# Patient Record
Sex: Female | Born: 1935 | Race: White | Hispanic: No | Marital: Single | Smoking: Never smoker
Health system: Southern US, Community
[De-identification: ages and names within clinical notes are randomized; demographics above are authoritative.]

## PROBLEM LIST (undated history)

## (undated) DIAGNOSIS — E039 Hypothyroidism, unspecified: Secondary | ICD-10-CM

## (undated) DIAGNOSIS — I639 Cerebral infarction, unspecified: Secondary | ICD-10-CM

## (undated) DIAGNOSIS — I10 Essential (primary) hypertension: Secondary | ICD-10-CM

## (undated) HISTORY — DX: Essential (primary) hypertension: I10

## (undated) HISTORY — PX: HYSTERECTOMY ABDOMINAL WITH SALPINGO-OOPHORECTOMY: SHX6792

---

## 2015-06-02 DEATH — deceased

## 2017-06-20 DIAGNOSIS — M1712 Unilateral primary osteoarthritis, left knee: Secondary | ICD-10-CM | POA: Insufficient documentation

## 2017-06-30 DIAGNOSIS — I10 Essential (primary) hypertension: Secondary | ICD-10-CM | POA: Insufficient documentation

## 2018-01-15 DIAGNOSIS — E039 Hypothyroidism, unspecified: Secondary | ICD-10-CM | POA: Insufficient documentation

## 2020-01-27 ENCOUNTER — Encounter: Payer: Self-pay | Admitting: Nurse Practitioner

## 2020-01-27 ENCOUNTER — Ambulatory Visit (INDEPENDENT_AMBULATORY_CARE_PROVIDER_SITE_OTHER): Payer: Medicare Other | Admitting: Nurse Practitioner

## 2020-01-27 ENCOUNTER — Other Ambulatory Visit: Payer: Self-pay

## 2020-01-27 VITALS — BP 148/68 | HR 65 | Temp 97.2°F | Ht 60.0 in | Wt 145.2 lb

## 2020-01-27 DIAGNOSIS — I10 Essential (primary) hypertension: Secondary | ICD-10-CM | POA: Diagnosis not present

## 2020-01-27 DIAGNOSIS — E039 Hypothyroidism, unspecified: Secondary | ICD-10-CM | POA: Diagnosis not present

## 2020-01-27 DIAGNOSIS — H9190 Unspecified hearing loss, unspecified ear: Secondary | ICD-10-CM | POA: Insufficient documentation

## 2020-01-27 DIAGNOSIS — N3000 Acute cystitis without hematuria: Secondary | ICD-10-CM

## 2020-01-27 DIAGNOSIS — I700 Atherosclerosis of aorta: Secondary | ICD-10-CM

## 2020-01-27 LAB — BASIC METABOLIC PANEL
BUN: 20 mg/dL (ref 6–23)
CO2: 32 mEq/L (ref 19–32)
Calcium: 9.8 mg/dL (ref 8.4–10.5)
Chloride: 102 mEq/L (ref 96–112)
Creatinine, Ser: 1.15 mg/dL (ref 0.40–1.20)
GFR: 44.01 mL/min — ABNORMAL LOW (ref >60.00)
Glucose, Bld: 126 mg/dL — ABNORMAL HIGH (ref 70–99)
Potassium: 4.6 mEq/L (ref 3.5–5.1)
Sodium: 137 mEq/L (ref 135–145)

## 2020-01-27 LAB — TSH: TSH: 10.02 u[IU]/mL — ABNORMAL HIGH (ref 0.35–4.50)

## 2020-01-27 LAB — T4, FREE: Free T4: 1.13 ng/dL (ref 0.60–1.60)

## 2020-01-27 NOTE — Progress Notes (Signed)
Subjective:  Patient ID: Shannon Morales, female    DOB: 12-06-1924  Age: 86 y.o. MRN: [id number]  CC: Establish Care (New patient, hospital follow up, seen for UTI no concerns. )  HPI Accompanied by daughter. She lives alone, but er daughter checks on her daily. She is independent with ADLs, but dependents on her daughter for transportation. UTI and GI symptoms and dizziness has resolved with use of oral abx and oral hydration.  Essential hypertension Stable BP with coreg, amlodipine and diovan/HCTZ Reviewed OV notes from previous pcp with [facility]: 04/2019 151/77 and 07/2019 198/78 BP Readings from Last 3 Encounters:  01/27/20 (!) 148/68   Maintain current dose. Check BMP today  Aortic atherosclerosis ([place]) Noted on CT ABD/pelvis completed during recent ED visit 01/22/2020. Last lipid panel done by previous pcp with [facility] 2017: TC 213, Trig 112, LDL 127. She is not interested in use of statin at this time No tobacco use No known hx of CAD/MI/CVD/PAD/DM  Hypothyroidism Elevated TSH in past. Repeat TSH and T4 today. Maintain current dose of levothyroxine while waiting for results  Reviewed past Medical, Social and Family history today.  Outpatient Medications Prior to Visit  Medication Sig Dispense Refill  . carvedilol (COREG) 12.5 MG tablet Take 12.5 mg by mouth daily.    . cephALEXin (KEFLEX) 500 MG capsule Take 500 mg by mouth 4 (four) times daily.    Marland Kitchen amLODipine (NORVASC) 5 MG tablet Take 5 mg by mouth daily.    Marland Kitchen levothyroxine (LEVOXYL) 75 MCG tablet Take 75 mcg by mouth in the morning.    . potassium chloride (KLOR-CON) 10 MEQ tablet Take 10 mEq by mouth in the morning.    . valsartan-hydrochlorothiazide (DIOVAN-HCT) 320-25 MG tablet Take 1 tablet by mouth daily.     No facility-administered medications prior to visit.    ROS See HPI  Objective:  BP (!) 148/68   Pulse 65   Temp (!) 97.2 F (36.2 C) (Tympanic)   Ht 5' (1.524 m)   Wt 145 lb 3.2 oz (65.9 kg)   SpO2  95%   BMI 28.36 kg/m   Physical Exam Vitals reviewed.  Cardiovascular:     Rate and Rhythm: Normal rate and regular rhythm.     Pulses: Normal pulses.     Heart sounds: Normal heart sounds.  Pulmonary:     Effort: Pulmonary effort is normal.     Breath sounds: Normal breath sounds.  Abdominal:     General: Bowel sounds are normal. There is no distension.     Palpations: Abdomen is soft.     Tenderness: There is no abdominal tenderness.  Musculoskeletal:     Right lower leg: No edema.     Left lower leg: No edema.  Skin:    General: Skin is warm and dry.  Neurological:     Mental Status: She is alert and oriented to person, place, and time.  Psychiatric:        Mood and Affect: Mood normal.        Behavior: Behavior normal.        Thought Content: Thought content normal.    Assessment & Plan:  This visit occurred during the SARS-CoV-2 public health emergency.  Safety protocols were in place, including screening questions prior to the visit, additional usage of staff PPE, and extensive cleaning of exam room while observing appropriate contact time as indicated for disinfecting solutions.   Shanyce was seen today for establish care.  Diagnoses and all  orders for this visit:  Essential hypertension -     Basic metabolic panel -     amLODipine (NORVASC) 5 MG tablet; Take 1 tablet (5 mg total) by mouth daily. -     potassium chloride (KLOR-CON) 10 MEQ tablet; Take 1 tablet (10 mEq total) by mouth in the morning. -     valsartan-hydrochlorothiazide (DIOVAN-HCT) 320-12.5 MG tablet; Take 1 tablet by mouth daily.  Hypothyroidism, unspecified type -     T4, free -     TSH -     levothyroxine (LEVOXYL) 75 MCG tablet; Take 1 tablet (75 mcg total) by mouth in the morning.  Aortic atherosclerosis (H[facility])  Acute cystitis without hematuria -     Urinalysis w microscopic + reflex cultur -     REFLEXIVE URINE CULTURE    Problem List Items Addressed This Visit      Cardiovascular  and Mediastinum   Aortic atherosclerosis ([place])    Noted on CT ABD/pelvis completed during recent ED visit 01/22/2020. Last lipid panel done by previous pcp with [facility] 2017: TC 213, Trig 112, LDL 127. She is not interested in use of statin at this time No tobacco use No known hx of CAD/MI/CVD/PAD/DM      Relevant Medications   carvedilol (COREG) 12.5 MG tablet   amLODipine (NORVASC) 5 MG tablet   valsartan-hydrochlorothiazide (DIOVAN-HCT) 320-12.5 MG tablet   Essential hypertension - Primary    Stable BP with coreg, amlodipine and diovan/HCTZ Reviewed OV notes from previous pcp with [facility]: 04/2019 151/77 and 07/2019 198/78 BP Readings from Last 3 Encounters:  01/27/20 (!) 148/68   Maintain current dose. Check BMP today      Relevant Medications   carvedilol (COREG) 12.5 MG tablet   amLODipine (NORVASC) 5 MG tablet   potassium chloride (KLOR-CON) 10 MEQ tablet   valsartan-hydrochlorothiazide (DIOVAN-HCT) 320-12.5 MG tablet   Other Relevant Orders   Basic metabolic panel (Completed)     Endocrine   Hypothyroidism    Elevated TSH in past. Repeat TSH and T4 today. Maintain current dose of levothyroxine while waiting for results      Relevant Medications   carvedilol (COREG) 12.5 MG tablet   levothyroxine (LEVOXYL) 75 MCG tablet   Other Relevant Orders   T4, free (Completed)   TSH (Completed)    Other Visit Diagnoses    Acute cystitis without hematuria       Relevant Medications   cephALEXin (KEFLEX) 500 MG capsule   Other Relevant Orders   Urinalysis w microscopic + reflex cultur (Completed)   REFLEXIVE URINE CULTURE (Completed)      I have spent 30mins with this patient regarding history taking, documentation, review of ED notes/labs/radiology and previous pcp notes, formulating plan and discussing treatment options with patient.  Follow-up: Return in about 3 months (around 04/28/2020) for HTN and hypothyroidism (F2F, 64mins).  Wilfred Lacy, NP

## 2020-01-27 NOTE — Assessment & Plan Note (Signed)
Elevated TSH in past. Repeat TSH and T4 today. Maintain current dose of levothyroxine while waiting for results

## 2020-01-27 NOTE — Patient Instructions (Signed)
Thank you for choosing  [facility] for your health needs.  Please bring copy of living will and health care power of attorney.  Go to lab for blood draw and urine collection.

## 2020-01-27 NOTE — Assessment & Plan Note (Signed)
Noted on CT ABD/pelvis completed during recent ED visit 01/22/2020. Last lipid panel done by previous pcp with [facility] 2017: TC 213, Trig 112, LDL 127. She is not interested in use of statin at this time No tobacco use No known hx of CAD/MI/CVD/PAD/DM

## 2020-01-27 NOTE — Assessment & Plan Note (Addendum)
Stable BP with coreg, amlodipine and diovan/HCTZ Reviewed OV notes from previous pcp with [facility]: 04/2019 151/77 and 07/2019 198/78 BP Readings from Last 3 Encounters:  01/27/20 (!) 148/68   Maintain current dose. Check BMP today

## 2020-01-28 ENCOUNTER — Telehealth: Payer: Self-pay | Admitting: Nurse Practitioner

## 2020-01-28 LAB — URINALYSIS W MICROSCOPIC + REFLEX CULTURE
Bacteria, UA: NONE SEEN /HPF
Bilirubin Urine: NEGATIVE
Glucose, UA: NEGATIVE
Hgb urine dipstick: NEGATIVE
Hyaline Cast: NONE SEEN /LPF
Ketones, ur: NEGATIVE
Leukocyte Esterase: NEGATIVE
Nitrites, Initial: NEGATIVE
Protein, ur: NEGATIVE
RBC / HPF: NONE SEEN /HPF (ref 0–2)
Specific Gravity, Urine: 1.005 (ref 1.001–1.03)
Squamous Epithelial / HPF: NONE SEEN /HPF (ref <=5)
WBC, UA: NONE SEEN /HPF (ref 0–5)
pH: 6.5 (ref 5.0–8.0)

## 2020-01-28 LAB — NO CULTURE INDICATED

## 2020-01-28 MED ORDER — LEVOTHYROXINE SODIUM 75 MCG PO TABS: 75.0000 ug | ORAL_TABLET | Freq: Every morning | ORAL | 1 refills | Status: DC

## 2020-01-28 MED ORDER — POTASSIUM CHLORIDE ER 10 MEQ PO TBCR: 10.0000 meq | EXTENDED_RELEASE_TABLET | Freq: Every morning | ORAL | 1 refills | Status: DC

## 2020-01-28 MED ORDER — VALSARTAN-HYDROCHLOROTHIAZIDE 320-12.5 MG PO TABS: 1.0000 | ORAL_TABLET | Freq: Every day | ORAL | 1 refills | Status: DC

## 2020-01-28 MED ORDER — AMLODIPINE BESYLATE 5 MG PO TABS: 5.0000 mg | ORAL_TABLET | Freq: Every day | ORAL | 3 refills | Status: DC

## 2020-01-28 NOTE — Telephone Encounter (Signed)
Patient daughter is calling and wanted to see if patients lab results were back, please advise. Also patient daughter is requesting a call back regarding medication.  CB is [phone]

## 2020-01-29 ENCOUNTER — Telehealth: Payer: Self-pay

## 2020-01-29 NOTE — Telephone Encounter (Signed)
I have never seen patient and with all of our new patients with 2 providers leaving, I am unfortunately not able to accommodate patient

## 2020-01-29 NOTE — Telephone Encounter (Signed)
Spoke with patients daughter, very pleasant, she states her mom  likes Baldo Ash however she wanted Dr. Loletha Grayer as her PCP.  Baldo Ash do you mind if patient switches to Dr. Loletha Grayer?  Please advise

## 2020-01-29 NOTE — Telephone Encounter (Signed)
Spoke with patients daughter and let her know about her moms results, she voiced her understanding

## 2020-02-11 NOTE — Telephone Encounter (Signed)
Called patient left message for the daughter to give me a call

## 2020-04-12 ENCOUNTER — Telehealth: Payer: Self-pay | Admitting: Nurse Practitioner

## 2020-04-12 ENCOUNTER — Emergency Department (HOSPITAL_COMMUNITY): Payer: Medicare Other

## 2020-04-12 ENCOUNTER — Encounter (HOSPITAL_COMMUNITY): Payer: Self-pay

## 2020-04-12 ENCOUNTER — Other Ambulatory Visit: Payer: Self-pay

## 2020-04-12 ENCOUNTER — Observation Stay (HOSPITAL_COMMUNITY)
Admission: EM | Admit: 2020-04-12 | Discharge: 2020-04-13 | Disposition: A | Discharge status: Home / Self Care | Payer: Medicare Other | Attending: Family Medicine | Admitting: Family Medicine

## 2020-04-12 ENCOUNTER — Observation Stay (HOSPITAL_COMMUNITY): Payer: Medicare Other

## 2020-04-12 DIAGNOSIS — R269 Unspecified abnormalities of gait and mobility: Secondary | ICD-10-CM | POA: Diagnosis present

## 2020-04-12 DIAGNOSIS — R1031 Right lower quadrant pain: Secondary | ICD-10-CM | POA: Insufficient documentation

## 2020-04-12 DIAGNOSIS — I10 Essential (primary) hypertension: Secondary | ICD-10-CM | POA: Diagnosis present

## 2020-04-12 DIAGNOSIS — Z20822 Contact with and (suspected) exposure to covid-19: Secondary | ICD-10-CM | POA: Insufficient documentation

## 2020-04-12 DIAGNOSIS — Z8673 Personal history of transient ischemic attack (TIA), and cerebral infarction without residual deficits: Secondary | ICD-10-CM | POA: Insufficient documentation

## 2020-04-12 DIAGNOSIS — H8111 Benign paroxysmal vertigo, right ear: Secondary | ICD-10-CM

## 2020-04-12 DIAGNOSIS — I639 Cerebral infarction, unspecified: Principal | ICD-10-CM | POA: Insufficient documentation

## 2020-04-12 DIAGNOSIS — Z79899 Other long term (current) drug therapy: Secondary | ICD-10-CM | POA: Insufficient documentation

## 2020-04-12 DIAGNOSIS — Z9181 History of falling: Secondary | ICD-10-CM | POA: Diagnosis not present

## 2020-04-12 DIAGNOSIS — E039 Hypothyroidism, unspecified: Secondary | ICD-10-CM | POA: Diagnosis present

## 2020-04-12 DIAGNOSIS — K591 Functional diarrhea: Secondary | ICD-10-CM | POA: Insufficient documentation

## 2020-04-12 DIAGNOSIS — R413 Other amnesia: Secondary | ICD-10-CM | POA: Insufficient documentation

## 2020-04-12 DIAGNOSIS — R55 Syncope and collapse: Secondary | ICD-10-CM | POA: Diagnosis present

## 2020-04-12 DIAGNOSIS — I69398 Other sequelae of cerebral infarction: Secondary | ICD-10-CM | POA: Diagnosis present

## 2020-04-12 HISTORY — DX: Functional diarrhea: K59.1

## 2020-04-12 HISTORY — DX: Cerebral infarction, unspecified: I63.9

## 2020-04-12 HISTORY — DX: Hypothyroidism, unspecified: E03.9

## 2020-04-12 LAB — I-STAT CHEM 8, ED
BUN: 17 mg/dL (ref 8–23)
Calcium, Ion: 1.15 mmol/L (ref 1.15–1.40)
Chloride: 109 mmol/L (ref 98–111)
Creatinine, Ser: 0.8 mg/dL (ref 0.44–1.00)
Glucose, Bld: 170 mg/dL — ABNORMAL HIGH (ref 70–99)
HCT: 38 % (ref 36.0–46.0)
Hemoglobin: 12.9 g/dL (ref 12.0–15.0)
Potassium: 3.4 mmol/L — ABNORMAL LOW (ref 3.5–5.1)
Sodium: 142 mmol/L (ref 135–145)
TCO2: 24 mmol/L (ref 22–32)

## 2020-04-12 LAB — TSH: TSH: 5.333 u[IU]/mL — ABNORMAL HIGH (ref 0.350–4.500)

## 2020-04-12 LAB — COMPREHENSIVE METABOLIC PANEL
ALT: 16 U/L (ref 0–44)
AST: 23 U/L (ref 15–41)
Albumin: 3.8 g/dL (ref 3.5–5.0)
Alkaline Phosphatase: 45 U/L (ref 38–126)
Anion gap: 12 (ref 5–15)
BUN: 15 mg/dL (ref 8–23)
CO2: 20 mmol/L — ABNORMAL LOW (ref 22–32)
Calcium: 9.1 mg/dL (ref 8.9–10.3)
Chloride: 108 mmol/L (ref 98–111)
Creatinine, Ser: 0.88 mg/dL (ref 0.44–1.00)
GFR, Estimated: 57 mL/min — ABNORMAL LOW (ref >60)
Glucose, Bld: 170 mg/dL — ABNORMAL HIGH (ref 70–99)
Potassium: 3.6 mmol/L (ref 3.5–5.1)
Sodium: 140 mmol/L (ref 135–145)
Total Bilirubin: 0.4 mg/dL (ref 0.3–1.2)
Total Protein: 7.1 g/dL (ref 6.5–8.1)

## 2020-04-12 LAB — CBC WITH DIFFERENTIAL/PLATELET
Abs Immature Granulocytes: 0.13 10*3/uL — ABNORMAL HIGH (ref 0.00–0.07)
Basophils Absolute: 0.1 10*3/uL (ref 0.0–0.1)
Basophils Relative: 1 %
Eosinophils Absolute: 0.1 10*3/uL (ref 0.0–0.5)
Eosinophils Relative: 0 %
HCT: 41.3 % (ref 36.0–46.0)
Hemoglobin: 13.5 g/dL (ref 12.0–15.0)
Immature Granulocytes: 1 %
Lymphocytes Relative: 8 %
Lymphs Abs: 1 10*3/uL (ref 0.7–4.0)
MCH: 30.5 pg (ref 26.0–34.0)
MCHC: 32.7 g/dL (ref 30.0–36.0)
MCV: 93.2 fL (ref 80.0–100.0)
Monocytes Absolute: 0.8 10*3/uL (ref 0.1–1.0)
Monocytes Relative: 6 %
Neutro Abs: 11.1 10*3/uL — ABNORMAL HIGH (ref 1.7–7.7)
Neutrophils Relative %: 84 %
Platelets: 260 10*3/uL (ref 150–400)
RBC: 4.43 MIL/uL (ref 3.87–5.11)
RDW: 12.6 % (ref 11.5–15.5)
WBC: 13.3 10*3/uL — ABNORMAL HIGH (ref 4.0–10.5)
nRBC: 0 % (ref 0.0–0.2)

## 2020-04-12 LAB — TROPONIN I (HIGH SENSITIVITY)
Troponin I (High Sensitivity): 12 ng/L (ref <18)
Troponin I (High Sensitivity): 21 ng/L — ABNORMAL HIGH (ref <18)

## 2020-04-12 LAB — RESPIRATORY PANEL BY RT PCR (FLU A&B, COVID)
Influenza A by PCR: NEGATIVE
Influenza B by PCR: NEGATIVE
SARS Coronavirus 2 by RT PCR: NEGATIVE

## 2020-04-12 LAB — LIPASE, BLOOD: Lipase: 33 U/L (ref 11–51)

## 2020-04-12 MED ORDER — ACETAMINOPHEN 650 MG RE SUPP: 650.0000 mg | RECTAL | Status: DC | PRN

## 2020-04-12 MED ORDER — LEVOTHYROXINE SODIUM 100 MCG PO TABS
100.0000 ug | ORAL_TABLET | Freq: Every day | ORAL | Status: DC
  Administered 2020-04-13: 100 ug via ORAL
  Filled 2020-04-12: qty 1

## 2020-04-12 MED ORDER — HYDROCHLOROTHIAZIDE 12.5 MG PO CAPS
12.5000 mg | ORAL_CAPSULE | Freq: Once | ORAL | Status: AC
  Administered 2020-04-12: 12.5 mg via ORAL
  Filled 2020-04-12: qty 1

## 2020-04-12 MED ORDER — SODIUM CHLORIDE 0.9 % IV SOLN: INTRAVENOUS | Status: DC

## 2020-04-12 MED ORDER — CARVEDILOL 3.125 MG PO TABS: 12.5000 mg | ORAL_TABLET | Freq: Every day | ORAL | Status: DC

## 2020-04-12 MED ORDER — DIPHENHYDRAMINE HCL 50 MG/ML IJ SOLN
12.5000 mg | Freq: Once | INTRAMUSCULAR | Status: AC
  Administered 2020-04-12: 12.5 mg via INTRAVENOUS
  Filled 2020-04-12: qty 1

## 2020-04-12 MED ORDER — IOHEXOL 350 MG/ML SOLN
100.0000 mL | Freq: Once | INTRAVENOUS | Status: AC | PRN
  Administered 2020-04-12: 60 mL via INTRAVENOUS

## 2020-04-12 MED ORDER — ASPIRIN 300 MG RE SUPP: 300.0000 mg | Freq: Every day | RECTAL | Status: DC

## 2020-04-12 MED ORDER — VALSARTAN-HYDROCHLOROTHIAZIDE 320-12.5 MG PO TABS: 1.0000 | ORAL_TABLET | Freq: Once | ORAL | Status: DC

## 2020-04-12 MED ORDER — ATORVASTATIN CALCIUM 40 MG PO TABS
40.0000 mg | ORAL_TABLET | Freq: Every day | ORAL | Status: DC
  Administered 2020-04-12 – 2020-04-13 (×2): 40 mg via ORAL
  Filled 2020-04-12 (×2): qty 1

## 2020-04-12 MED ORDER — IRBESARTAN 300 MG PO TABS
300.0000 mg | ORAL_TABLET | Freq: Once | ORAL | Status: AC
  Administered 2020-04-12: 300 mg via ORAL
  Filled 2020-04-12: qty 1

## 2020-04-12 MED ORDER — STROKE: EARLY STAGES OF RECOVERY BOOK
Freq: Once | Status: AC
  Filled 2020-04-12: qty 1

## 2020-04-12 MED ORDER — SODIUM CHLORIDE 0.9 % IV BOLUS
1000.0000 mL | Freq: Once | INTRAVENOUS | Status: AC
  Administered 2020-04-12: 1000 mL via INTRAVENOUS

## 2020-04-12 MED ORDER — IOHEXOL 350 MG/ML SOLN
100.0000 mL | Freq: Once | INTRAVENOUS | Status: AC | PRN
  Administered 2020-04-12: 100 mL via INTRAVENOUS

## 2020-04-12 MED ORDER — LEVOTHYROXINE SODIUM 75 MCG PO TABS: 75.0000 ug | ORAL_TABLET | Freq: Every morning | ORAL | Status: DC

## 2020-04-12 MED ORDER — SENNOSIDES-DOCUSATE SODIUM 8.6-50 MG PO TABS: 1.0000 | ORAL_TABLET | Freq: Every evening | ORAL | Status: DC | PRN

## 2020-04-12 MED ORDER — AMLODIPINE BESYLATE 5 MG PO TABS
5.0000 mg | ORAL_TABLET | Freq: Once | ORAL | Status: AC
  Administered 2020-04-12: 5 mg via ORAL
  Filled 2020-04-12: qty 1

## 2020-04-12 MED ORDER — PROCHLORPERAZINE EDISYLATE 10 MG/2ML IJ SOLN
5.0000 mg | Freq: Once | INTRAMUSCULAR | Status: AC
  Administered 2020-04-12: 5 mg via INTRAVENOUS
  Filled 2020-04-12: qty 2

## 2020-04-12 MED ORDER — ACETAMINOPHEN 160 MG/5ML PO SOLN: 650.0000 mg | ORAL | Status: DC | PRN

## 2020-04-12 MED ORDER — ACETAMINOPHEN 325 MG PO TABS: 650.0000 mg | ORAL_TABLET | ORAL | Status: DC | PRN

## 2020-04-12 MED ORDER — MECLIZINE HCL 25 MG PO TABS
25.0000 mg | ORAL_TABLET | Freq: Once | ORAL | Status: AC
  Administered 2020-04-12: 25 mg via ORAL
  Filled 2020-04-12: qty 1

## 2020-04-12 MED ORDER — ASPIRIN 325 MG PO TABS
325.0000 mg | ORAL_TABLET | Freq: Every day | ORAL | Status: DC
  Administered 2020-04-12: 325 mg via ORAL
  Filled 2020-04-12 (×2): qty 1

## 2020-04-12 MED ORDER — ENOXAPARIN SODIUM 40 MG/0.4ML ~~LOC~~ SOLN
40.0000 mg | SUBCUTANEOUS | Status: DC
  Administered 2020-04-12: 40 mg via SUBCUTANEOUS
  Filled 2020-04-12: qty 0.4

## 2020-04-12 MED ORDER — CARVEDILOL 3.125 MG PO TABS
12.5000 mg | ORAL_TABLET | Freq: Once | ORAL | Status: AC
  Administered 2020-04-12: 12.5 mg via ORAL
  Filled 2020-04-12: qty 4

## 2020-04-12 MED ORDER — AMLODIPINE BESYLATE 5 MG PO TABS: 5.0000 mg | ORAL_TABLET | Freq: Every day | ORAL | Status: DC

## 2020-04-12 MED ORDER — MECLIZINE HCL 12.5 MG PO TABS: 25.0000 mg | ORAL_TABLET | Freq: Three times a day (TID) | ORAL | Status: DC | PRN

## 2020-04-12 NOTE — ED Notes (Signed)
Patient transported to MRI 

## 2020-04-12 NOTE — Telephone Encounter (Signed)
FYI

## 2020-04-12 NOTE — ED Notes (Signed)
Pt assisted to chair and was very unsteady, no complaints of pain. Pt stated she does not know when she has to urinate. Pt is incontinent. Unable to measure urine.

## 2020-04-12 NOTE — ED Triage Notes (Signed)
Pt BIB [facility] d/t a near syncope episode during the night. Pt reports that she went to the bathroom to have a BM during the night & had a near syncope episode, pt fell to the ground but denies hitting her head, states she fell on her bottom. Pt states she does not know how long she was in the floor but denies LOC & she was finally able to pull herself out of the floor and call 911. EMS reports that pt has had vision changes ever since then with blurred vision, accompanied with HA & n/v. Also reports incontinence of BM, which is not normal. Upon arrival to ED pt is A/Ox4, verbal- able to make needs known.

## 2020-04-12 NOTE — ED Notes (Signed)
Pt returned from MRI °

## 2020-04-12 NOTE — Consult Note (Addendum)
Neurology Consultation  Reason for Consult: Subacute right parietal infarct seen on DWI Referring Physician: Dr. Lorin Mercy  CC: Dizziness and blurred vision which has resolved  History is obtained from: Patient  HPI: Shannon Morales is a 86 y.o. female with history of hypothyroidism, hypertension, CVA.  Patient states that she went to bed last night at approximately 10:30 at night.  She got up in the morning and noted she was dizzy with sitting up. She got up slowly and was able to make to the bathroom. She went into the bathroom to urinate and then fell backwards in the bathroom due to dizziness. She describes this dizziness as room spinning. She states that she was in the tub for a little while but finally was able to get herself out of the tub and back into her bed.  As of that time she had increased dizziness but also notes that she has some blurred vision.  She attempted to call her daughter however, the spinning and her vision prevented her from dialing the number so she called 911.  Of note upon arriving at the hospital her blood pressure was 193/88. over the time that she has been in the ED her blood pressure has been controlled and brought down to 130/64.  At this point time she denies any blurred vision and or dizziness. Althou this comes on when she looks to her right. She denies headache but she does states she has had some diarrhea yesterday but this is normal for her.  Patient states that she does not take any aspirin.  LKW: 10:30 PM on 04/11/2020 tpa given?: no, out of window in addition NIH stroke scale of 0 Premorbid modified Rankin scale (mRS): 0 NIHSS 1a Level of Conscious.: 0 1b LOC Questions: 0 1c LOC Commands: 0 2 Best Gaze: 0 3 Visual: 0 4 Facial Palsy: 0 5a Motor Arm - left: 0 5b Motor Arm - Right: 0 6a Motor Leg - Left: 0 6b Motor Leg - Right: 0 7 Limb Ataxia: 0 8 Sensory: 0 9 Best Language: 0 10 Dysarthria: 0 11 Extinct. and Inatten.: 0 TOTAL: 0  Past Medical  History:  Diagnosis Date   CVA (cerebral vascular accident) ([place])    Hypertension    Hypothyroidism (acquired)     Family History  Problem Relation Age of Onset   Throat cancer Mother    CAD Father    CAD Sister    Cancer Sister    Stroke Neg Hx    Social History:   reports that she has never smoked. She has never used smokeless tobacco. She reports current alcohol use. She reports that she does not use drugs.  Medications No current facility-administered medications for this encounter.  Current Outpatient Medications:    amLODipine (NORVASC) 5 MG tablet, Take 1 tablet (5 mg total) by mouth daily., Disp: 90 tablet, Rfl: 3   carvedilol (COREG) 12.5 MG tablet, Take 12.5 mg by mouth daily., Disp: , Rfl:    cephALEXin (KEFLEX) 500 MG capsule, Take 500 mg by mouth 4 (four) times daily., Disp: , Rfl:    levothyroxine (LEVOXYL) 75 MCG tablet, Take 1 tablet (75 mcg total) by mouth in the morning., Disp: 90 tablet, Rfl: 1   potassium chloride (KLOR-CON) 10 MEQ tablet, Take 1 tablet (10 mEq total) by mouth in the morning., Disp: 90 tablet, Rfl: 1   valsartan-hydrochlorothiazide (DIOVAN-HCT) 320-12.5 MG tablet, Take 1 tablet by mouth daily., Disp: 90 tablet, Rfl: 1  ROS:   General ROS:  negative for - chills, fatigue, fever, night sweats, weight gain or weight loss Psychological ROS: negative for - behavioral disorder, hallucinations, memory difficulties, mood swings or suicidal ideation Ophthalmic ROS: Positive for -transient blurry vision ENT ROS: negative for - epistaxis, nasal discharge, oral lesions, sore throat, tinnitus or vertigo Allergy and Immunology ROS: negative for - hives or itchy/watery eyes Hematological and Lymphatic ROS: negative for - bleeding problems, bruising or swollen lymph nodes Endocrine ROS: negative for - galactorrhea, hair pattern changes, polydipsia/polyuria or temperature intolerance Respiratory ROS: negative for - cough, hemoptysis, shortness of breath or  wheezing Cardiovascular ROS: negative for - chest pain, dyspnea on exertion, edema or irregular heartbeat Gastrointestinal ROS: negative for - abdominal pain, diarrhea, hematemesis, nausea/vomiting or stool incontinence Genito-Urinary ROS: negative for - dysuria, hematuria, incontinence or urinary frequency/urgency Musculoskeletal ROS: negative for - joint swelling or muscular weakness Neurological ROS: as noted in HPI Dermatological ROS: negative for rash and skin lesion changes  Exam: Current vital signs: BP 130/64    Pulse 76    Temp (!) 97.3 F (36.3 C) (Oral)    Resp 20    LMP  (LMP Unknown)    SpO2 96%  Vital signs in last 24 hours: Temp:  [97.3 F (36.3 C)] 97.3 F (36.3 C) (10/12 0855) Pulse Rate:  [69-97] 76 (10/12 1345) Resp:  [12-30] 20 (10/12 1345) BP: (109-193)/(48-89) 130/64 (10/12 1345) SpO2:  [92 %-100 %] 96 % (10/12 1345)   Constitutional: Appears well-developed and well-nourished.  Psych: Affect appropriate to situation Eyes: No scleral injection HENT: No OP obstrucion Head: Normocephalic.  Cardiovascular: Normal rate and regular rhythm.  Respiratory: Effort normal, non-labored breathing GI: Soft.  No distension. There is no tenderness.  Skin: WDI  Neuro: Mental Status: Patient is awake, alert, oriented to person, place, month, year, and situation. Speech-intact naming, repeating, comprehension.  No dysarthria or aphasia.  Patient was able to follow commands with no difficulty. Cranial Nerves: II: Visual Fields are full.  III,IV, VI: EOMI without ptosis or diploplia. Pupils equal, round and reactive to light. She has gazed evoked nystagmus upon looking to the Right. No nystagmus when looking in the center or looking to left, up or down. V: Facial sensation is symmetric to temperature VII: Facial movement is symmetric.  VIII: hearing is intact to voice X: Palat elevates symmetrically XI: Shoulder shrug is symmetric. XII: tongue is midline without atrophy  or fasciculations.  Motor: Tone is normal. Bulk is normal. 5/5 strength was present in all four extremities.  No drift Sensory: Sensation is symmetric to light touch and temperature in the arms and legs. Deep Tendon Reflexes: 2+ and symmetric in the biceps and patellae.  Plantars: Toes are downgoing bilaterally.  Cerebellar: FNF and HKS are intact bilaterally  Labs I have reviewed labs in epic and the results pertinent to this consultation are:   CBC    Component Value Date/Time   WBC 13.3 (H) 04/12/2020 0911   RBC 4.43 04/12/2020 0911   HGB 12.9 04/12/2020 0927   HCT 38.0 04/12/2020 0927   PLT 260 04/12/2020 0911   MCV 93.2 04/12/2020 0911   MCH 30.5 04/12/2020 0911   MCHC 32.7 04/12/2020 0911   RDW 12.6 04/12/2020 0911   LYMPHSABS 1.0 04/12/2020 0911   MONOABS 0.8 04/12/2020 0911   EOSABS 0.1 04/12/2020 0911   BASOSABS 0.1 04/12/2020 0911    CMP     Component Value Date/Time   NA 142 04/12/2020 0927   K 3.4 (L) 04/12/2020  0927   CL 109 04/12/2020 0927   CO2 20 (L) 04/12/2020 0911   GLUCOSE 170 (H) 04/12/2020 0927   BUN 17 04/12/2020 0927   CREATININE 0.80 04/12/2020 0927   CALCIUM 9.1 04/12/2020 0911   PROT 7.1 04/12/2020 0911   ALBUMIN 3.8 04/12/2020 0911   AST 23 04/12/2020 0911   ALT 16 04/12/2020 0911   ALKPHOS 45 04/12/2020 0911   BILITOT 0.4 04/12/2020 0911   GFRNONAA 57 (L) 04/12/2020 0911     Imaging I have reviewed the images obtained:  CT-scan of the brain-no acute intracranial findings.  Chronic microvascular ischemic change and cerebral volume loss  MRI examination of the brain-mild right parietal DWI hyperintensity concerning for subacute infarct.  Etta Quill PA-C [facility] [phone]  M-F  (9:00 am- 5:00 PM)  04/12/2020, 4:20 PM     Assessment:  This is a 86 year old female who presented to the ED secondary to having episodic vertigo upon looking to her right, balance issues along with blurred vision.  Upon  entering the ED, she had SBP in 190s. Her vertigo is reproducible upon having her look to her right and she also has associated nystagmus with this episode. She has had full workup with an MRI brain which shows a tiny R parietal subacute infarct which I think is an incidental finding. No infarct in the posteroir circulation that could explain her vertigo. Her presentation seems to be more consistent with a peripheral vertigo, specifically BPPV given the short duration of her dizziness that is specifically triggered by looking to her Right. She denies any tinnitus or ear ache or ear fullness to suggest labyrinthitis or vestibular neuronitis.  Impression: - Small punctate R parietal subacute stroke. - BPPV  Recommend stroke workup with - Vessel imaging with either CTA Head and Neck or MRA head and Neck -Transthoracic Echo,  -Start patient on ASA 81mg  daily,  -Start or continue Atorvastatin 80 mg/other high intensity statin -BP goal: permissive HTN upto  [phone] systolically -HBAIC and Lipid profile -Telemetry monitoring -Frequent neuro checks -NPO until passes stroke swallow screen -PT/OT - Recommend Vestibular rehab outpatient for BPPV. - Can try Meclizine PRN for vertigo.   # please page stroke NP  Or  PA  Or MD from 8am -4 pm  as this patient from this time will be  followed by the stroke.   You can look them up on [email or web address]  Password [email or web address]

## 2020-04-12 NOTE — Progress Notes (Signed)
Received patient from [facility] ED

## 2020-04-12 NOTE — Progress Notes (Signed)
Received report on patient from ED; via [place].

## 2020-04-12 NOTE — ED Notes (Signed)
Pt dizzy when she stood to transfer from bed to [facility] (c 1 staff assist), EDP aware.

## 2020-04-12 NOTE — ED Provider Notes (Addendum)
[facility] EMERGENCY DEPARTMENT Provider Note   CSN: [id number] Arrival date & time: 04/12/20  6387     History Chief Complaint  Patient presents with  . Near Syncope    Shannon Morales is a 86 y.o. female with past medical history of hypertension and hypothyroidism who presents to the ED for a near syncopal episode.  Patient states that she was getting up of her bed and try to use the bathroom.  She states that she felt dizzy when stood up from her bed but still walked to her bathroom where she fell into the bathtub.  Denies hitting her head or loss of consciousness.  She said that she fell on her back.  Unsure how long she was down in the bathroom but able to pull herself off and call 911.  She denies palpitation, chest pain, shortness of breath, room spinning sensation, or extremities injury.  She also denies new neurological symptoms or weakness.  Patient reports an episode of diarrhea incontinence this morning.    Patient complains of headache and visual changes that started a few days ago.  No current headache.  Patient states that her vision is become more blurry, denies loss of vision in either eye or eye pain.  Per daughter, patient may have not taking her medications as instructed.  Blood pressure measured at home usually 564 systolic.  Patient also complains of loose stool but states she only has 1 bowel movements a day.  She also have some coughing with nausea.  Patient is living at home by herself and is independent with ADLs.  She states that she has been eating and drinking well the last few days and has not eaten anything differently.  Per daughter patient was seen about 6 weeks ago for the same complaint and thought to have a UTI and dehydration.  Currently patient denies dysuria but states she has frequency's.  HPI     Past Medical History:  Diagnosis Date  . Hypertension     Patient Active Problem List   Diagnosis Date Noted  . Memory changes 04/12/2020   . Functional diarrhea 04/12/2020  . Acute CVA (cerebrovascular accident) ([place]) 04/12/2020  . Aortic atherosclerosis ([place]) 01/27/2020  . Hearing loss 01/27/2020  . Hypothyroidism 01/15/2018  . Essential hypertension 06/30/2017  . Primary osteoarthritis of left knee 06/20/2017    Past Surgical History:  Procedure Laterality Date  . SMALL INTESTINE SURGERY       OB History   No obstetric history on file.     History reviewed. No pertinent family history.  Social History   Tobacco Use  . Smoking status: Never Smoker  . Smokeless tobacco: Never Used  Vaping Use  . Vaping Use: Never used  Substance Use Topics  . Alcohol use: Never  . Drug use: Never    Home Medications Prior to Admission medications   Medication Sig Start Date End Date Taking? Authorizing Provider  amLODipine (NORVASC) 5 MG tablet Take 1 tablet (5 mg total) by mouth daily. 01/28/20   Nche, Charlene Brooke, NP  carvedilol (COREG) 12.5 MG tablet Take 12.5 mg by mouth daily. 08/21/16 02/17/20  [provider]  cephALEXin (KEFLEX) 500 MG capsule Take 500 mg by mouth 4 (four) times daily. 01/22/20   [provider]  levothyroxine (LEVOXYL) 75 MCG tablet Take 1 tablet (75 mcg total) by mouth in the morning. 01/28/20   Nche, Charlene Brooke, NP  potassium chloride (KLOR-CON) 10 MEQ tablet Take 1 tablet (10  mEq total) by mouth in the morning. 01/28/20   Nche, Charlene Brooke, NP  valsartan-hydrochlorothiazide (DIOVAN-HCT) 320-12.5 MG tablet Take 1 tablet by mouth daily. 01/28/20   Nche, Charlene Brooke, NP    Allergies    Patient has no allergy information on record.  Review of Systems   Review of Systems  Constitutional: Negative for appetite change.  HENT: Negative for trouble swallowing.   Eyes:       Blurry vision  Respiratory: Positive for cough. Negative for chest tightness and shortness of breath.   Cardiovascular: Negative for chest pain and palpitations.  Gastrointestinal: Positive for diarrhea,  nausea and vomiting. Negative for abdominal pain.  Musculoskeletal: Negative for gait problem, joint swelling and neck pain.  Neurological: Positive for dizziness and headaches. Negative for facial asymmetry, speech difficulty and weakness.    Physical Exam Updated Vital Signs BP 130/64   Pulse 76   Temp (!) 97.3 F (36.3 C) (Oral)   Resp 20   LMP  (LMP Unknown)   SpO2 96%   Physical Exam Constitutional:      Appearance: She is not toxic-appearing.     Comments: Patient is alert and answer questions appropriately.  HENT:     Head: Normocephalic.     Comments: Nontender to palpation Eyes:     General:        Right eye: No discharge.        Left eye: No discharge.     Pupils: Pupils are equal, round, and reactive to light.     Comments: Nystagmus observed on the right   Cardiovascular:     Rate and Rhythm: Normal rate and regular rhythm.  Pulmonary:     Effort: No respiratory distress.     Breath sounds: Normal breath sounds. No wheezing.  Abdominal:     General: Bowel sounds are normal.     Palpations: Abdomen is soft.     Tenderness: There is abdominal tenderness. There is no right CVA tenderness or left CVA tenderness.     Comments: Tenderness to palpation at epigastric and right lower quadrant  Musculoskeletal:        General: No tenderness.     Cervical back: Normal range of motion. No rigidity or tenderness.     Right lower leg: No edema.     Left lower leg: No edema.     Comments: Nonmidline tenderness Normal range of motion upper and lower extremities.  No tenderness to palpation  Skin:    General: Skin is warm.     Coloration: Skin is not jaundiced.  Neurological:     Mental Status: She is alert.     Comments: PERRLA Cranial nerves no deficit Normal sensation bilateral upper and lower extremities Strength 5/5 right upper extremity Strength 4/5 left upper extremity, which patient states that is a chronic issue. Strength 5/5 bilateral lower extremities   Psychiatric:        Mood and Affect: Mood normal.     ED Results / Procedures / Treatments   Labs (all labs ordered are listed, but only abnormal results are displayed) Labs Reviewed  CBC WITH DIFFERENTIAL/PLATELET - Abnormal; Notable for the following components:      Result Value   WBC 13.3 (*)    Neutro Abs 11.1 (*)    Abs Immature Granulocytes 0.13 (*)    All other components within normal limits  COMPREHENSIVE METABOLIC PANEL - Abnormal; Notable for the following components:   CO2 20 (*)    Glucose,  Bld 170 (*)    GFR, Estimated 57 (*)    All other components within normal limits  TSH - Abnormal; Notable for the following components:   TSH 5.333 (*)    All other components within normal limits  I-STAT CHEM 8, ED - Abnormal; Notable for the following components:   Potassium 3.4 (*)    Glucose, Bld 170 (*)    All other components within normal limits  TROPONIN I (HIGH SENSITIVITY) - Abnormal; Notable for the following components:   Troponin I (High Sensitivity) 21 (*)    All other components within normal limits  RESPIRATORY PANEL BY RT PCR (FLU A&B, COVID)  LIPASE, BLOOD  TROPONIN I (HIGH SENSITIVITY)    EKG EKG Interpretation  Date/Time:  Tuesday April 12 2020 08:51:20 EDT Ventricular Rate:  84 PR Interval:    QRS Duration: 124 QT Interval:  404 QTC Calculation: 478 R Axis:   13 Text Interpretation: Sinus rhythm Ventricular bigeminy Right bundle branch block Inferior infarct, age indeterminate No old tracing to compare Confirmed by Deno Etienne [phone]) on 04/12/2020 9:07:25 AM   Radiology CT Head Wo Contrast  Result Date: 04/12/2020 CLINICAL DATA:  Head trauma.  Near syncope EXAM: CT HEAD WITHOUT CONTRAST TECHNIQUE: Contiguous axial images were obtained from the base of the skull through the vertex without intravenous contrast. COMPARISON:  None. FINDINGS: Brain: No evidence of acute infarction, hemorrhage, hydrocephalus, extra-axial collection or mass  lesion/mass effect. Scattered low-density changes within the periventricular and subcortical white matter compatible with chronic microvascular ischemic change. Mild diffuse cerebral volume loss. Vascular: Atherosclerotic calcifications involving the large vessels of the skull base. No unexpected hyperdense vessel. Skull: Normal. Negative for fracture or focal lesion. Sinuses/Orbits: No acute finding. Other: None. IMPRESSION: 1. No acute intracranial findings. 2. Chronic microvascular ischemic change and cerebral volume loss. Electronically Signed   By: Davina Poke D.O.   On: 04/12/2020 11:09   MR BRAIN WO CONTRAST  Result Date: 04/12/2020 CLINICAL DATA:  Head trauma, near syncope. EXAM: MRI HEAD WITHOUT CONTRAST TECHNIQUE: Multiplanar, multiecho pulse sequences of the brain and surrounding structures were obtained without intravenous contrast. COMPARISON:  04/12/2020 head CT and prior. FINDINGS: Brain: Mild diffusion-weighted hyperintensity without focal ADC correlate involving the right parietal lobe (2:34). No intracranial hemorrhage. No midline shift, ventriculomegaly or extra-axial fluid collection. No mass lesion. Mild diffuse parenchymal volume loss with ex vacuo dilatation. Mild chronic microvascular ischemic changes. Vascular: The right V4 segment is not well visualized. It may terminate as PICA or be diminutive. Otherwise grossly preserved major intracranial vessels. Skull and upper cervical spine: Normal marrow signal. Sinuses/Orbits: Sequela of bilateral lens replacement. Normal orbits. Mild ethmoid sinus mucosal thickening. Trace left mastoid free fluid. Other: None. IMPRESSION: Mild right parietal DWI hyperintensity concerning for subacute infarct. Mild cerebral atrophy and chronic microvascular ischemic changes. These results were called by telephone at the time of interpretation on 04/12/2020 at 2:38 pm to provider DAN FLOYD , who verbally acknowledged these results. Electronically Signed    By: Primitivo Gauze M.D.   On: 04/12/2020 14:39   CT ABDOMEN PELVIS W CONTRAST  Result Date: 04/12/2020 CLINICAL DATA:  86 year old female with history of right lower quadrant abdominal pain. EXAM: CT ABDOMEN AND PELVIS WITH CONTRAST TECHNIQUE: Multidetector CT imaging of the abdomen and pelvis was performed using the standard protocol following bolus administration of intravenous contrast. CONTRAST:  159mL OMNIPAQUE IOHEXOL 350 MG/ML SOLN COMPARISON:  CT the abdomen and pelvis 01/21/2020. FINDINGS: Lower chest: Atherosclerotic calcifications in  the descending thoracic aorta as well as the left main, left anterior descending, left circumflex and right coronary arteries. Thickening calcification of the aortic valve. Hepatobiliary: No suspicious cystic or solid hepatic lesions. No intra or extrahepatic biliary ductal dilatation. Status post cholecystectomy. Pancreas: No pancreatic mass. No pancreatic ductal dilatation. No pancreatic or peripancreatic fluid collections or inflammatory changes. Spleen: Unremarkable. Adrenals/Urinary Tract: Mild multifocal cortical thinning in the kidneys bilaterally. No suspicious renal lesions. Bilateral adrenal glands are normal in appearance. No hydroureteronephrosis. Urinary bladder is moderately distended, but otherwise unremarkable in appearance. Stomach/Bowel: Normal appearance of the stomach. No pathologic dilatation of small bowel or colon. Large periampullary duodenal diverticulum again noted, without surrounding inflammatory changes to suggest an associated diverticulitis. Status post right hemicolectomy. Vascular/Lymphatic: Aortic atherosclerosis, without evidence of aneurysm or dissection in the abdominal or pelvic vasculature. No lymphadenopathy noted in the abdomen or pelvis. Reproductive: Status post hysterectomy. Ovaries are not confidently identified may be surgically absent or atrophic. Other: No significant volume of ascites.  No pneumoperitoneum.  Musculoskeletal: Chronic appearing compression fracture of L1 with 40% loss of anterior vertebral body height, similar to the prior study. There are no aggressive appearing lytic or blastic lesions noted in the visualized portions of the skeleton. IMPRESSION: 1. No acute findings are noted in the abdomen or pelvis to account for the patient's symptoms. 2. Status post right hemicolectomy and cholecystectomy. 3. Aortic atherosclerosis, in addition to left main and 3 vessel coronary artery disease. 4. There are calcifications of the aortic valve. Echocardiographic correlation for evaluation of potential valvular dysfunction may be warranted if clinically indicated. 5. Additional incidental findings, as above. Electronically Signed   By: Vinnie Langton M.D.   On: 04/12/2020 11:18   DG Chest Port 1 View  Result Date: 04/12/2020 CLINICAL DATA:  Epigastric pain EXAM: PORTABLE CHEST 1 VIEW COMPARISON:  None. FINDINGS: Likely chronic mild interstitial prominence. No consolidation. No pleural effusion or pneumothorax. Cardiomediastinal contours are within normal limits for portable technique. There is calcified plaque along the thoracic aorta. Included upper abdomen is unremarkable apart from cholecystectomy clips. IMPRESSION: No acute process. Electronically Signed   By: Macy Mis M.D.   On: 04/12/2020 09:43    Procedures Procedures (including critical care time)  Medications Ordered in ED Medications  sodium chloride 0.9 % bolus 1,000 mL (0 mLs Intravenous Stopped 04/12/20 1147)  amLODipine (NORVASC) tablet 5 mg (5 mg Oral Given 04/12/20 1019)  carvedilol (COREG) tablet 12.5 mg (12.5 mg Oral Given 04/12/20 1019)  prochlorperazine (COMPAZINE) injection 5 mg (5 mg Intravenous Given 04/12/20 0937)  diphenhydrAMINE (BENADRYL) injection 12.5 mg (12.5 mg Intravenous Given 04/12/20 0938)  meclizine (ANTIVERT) tablet 25 mg (25 mg Oral Given 04/12/20 0936)  irbesartan (AVAPRO) tablet 300 mg (300 mg Oral  Given 04/12/20 1019)    And  hydrochlorothiazide (MICROZIDE) capsule 12.5 mg (12.5 mg Oral Given 04/12/20 1020)  iohexol (OMNIPAQUE) 350 MG/ML injection 100 mL (100 mLs Intravenous Contrast Given 04/12/20 1103)    ED Course  I have reviewed the triage vital signs and the nursing notes.  Pertinent labs & imaging results that were available during my care of the patient were reviewed by me and considered in my medical decision making (see chart for details).  Patient seen and examined.  She is nontender to palpation of spinous processes and head.  Neuro exams are normal except for nystagmus observed on the right side.  Normal range of motion of all extremities with no acute weakness.  Differentials include CVA,  BPPV, orthostatic hypotension, blurry vision or dehydration.  Low suspicion for ACS, PE, or UTI.  Obtain head CT, chest x-ray, CBC, CMP, TSH.  Also restart her home blood pressure medications.  For headache, give headache cocktail.  Patient is also tenderness to palpation of epigastric and right lower quadrant.  Given history of diarrhea and nausea, obtain abdominal CT, lipase, troponin.  EKG is negative for ischemia.  BP (!) 185/83   Pulse 79   Temp (!) 97.3 F (36.3 C) (Oral)   Resp 18   LMP  (LMP Unknown)   SpO2 96%   CT head and abdomen came back negative for any acute abnormalities.  Chest x-ray is clear.  Lab work also reassuring.  However patient still feels dizzy after sitting up on the side of the bed.  Will obtain brain MRI to rule out any CVA or abnormalities.  Her MRI shows right parietal hyperintensity concerning for subacute infarct.  Patient will be admitted for further management.  Neurology will be consulted    MDM Rules/Calculators/A&P                          Patient presents to the ED for a fall at home.  Patient stated she felt dizzy at loss of balance.  She did hit to head but denies loss of consciousness.  CT head is negative for any acute abnormalities.   Brain MRI however shows right parietal intensity concerning for subacute infarct.  This may be the cause of her near syncope event vs BPPV.  Lab work came back reassuring.  Troponin trend is unlikely to bet ACS.  CT abdomen pelvis is also negative for any acute abnormalities.  Patient will be admitted to the hospitalist service for further management.  Also consult neurology.    Final Clinical Impression(s) / ED Diagnoses Final diagnoses:  Cerebrovascular accident (CVA), unspecified mechanism [facility])    Rx / DC Orders ED Discharge Orders    None       Gaylan Gerold, DO 04/12/20 [place], [place], DO 04/12/20 [place], [place], DO 04/13/20 [phone]

## 2020-04-12 NOTE — H&P (Signed)
History and Physical    Shannon Morales DOA: 04/12/2020  PCP: Flossie Buffy, NP Consultants:  None Patient coming from:  Home - lives alone; NOK: Daughter, Sumaya Riedesel, [phone]   Chief Complaint: near syncope  HPI: Shannon Morales is a 86 y.o. female with medical history significant of HTN and hypothyroidism presenting with near syncope.  She got up overnight and was very dizzy.  She went to the bathroom and fell into the tub.  She laid there until she was able to get herself back to bed and then called for help.  She felt well all day yesterday.  +blurry vision "lately", maybe a couple of days.  She does note some left-sided facial numbness yesterday that is improved today.   ED Course:  Near syncope this AM - no LOC.  Normal CT but persistent dizziness.  MRI with subacute R parietal infarct.  Neurology consult pending.  Review of Systems: As per HPI; otherwise review of systems reviewed and negative.   Ambulatory Status:  Ambulates without assistance  COVID Vaccine Status:  Complete  Past Medical History:  Diagnosis Date  . CVA (cerebral vascular accident) ([place])   . Hypertension   . Hypothyroidism (acquired)     Past Surgical History:  Procedure Laterality Date  . HYSTERECTOMY ABDOMINAL WITH SALPINGO-OOPHORECTOMY     due to [facility]    Social History   Socioeconomic History  . Marital status: Single    Spouse name: Not on file  . Number of children: Not on file  . Years of education: Not on file  . Highest education level: Not on file  Occupational History  . Occupation: retired  Tobacco Use  . Smoking status: Never Smoker  . Smokeless tobacco: Never Used  Vaping Use  . Vaping Use: Never used  Substance and Sexual Activity  . Alcohol use: Yes    Comment: occasional wine  . Drug use: Never  . Sexual activity: Not on file  Other Topics Concern  . Not on file  Social History Narrative  . Not on file   Social Determinants of  Health   Financial Resource Strain:   . Difficulty of Paying Living Expenses: Not on file  Food Insecurity:   . Worried About Charity fundraiser in the Last Year: Not on file  . Ran Out of Food in the Last Year: Not on file  Transportation Needs:   . Lack of Transportation (Medical): Not on file  . Lack of Transportation (Non-Medical): Not on file  Physical Activity:   . Days of Exercise per Week: Not on file  . Minutes of Exercise per Session: Not on file  Stress:   . Feeling of Stress : Not on file  Social Connections:   . Frequency of Communication with Friends and Family: Not on file  . Frequency of Social Gatherings with Friends and Family: Not on file  . Attends Religious Services: Not on file  . Active Member of Clubs or Organizations: Not on file  . Attends Archivist Meetings: Not on file  . Marital Status: Not on file  Intimate Partner Violence:   . Fear of Current or Ex-Partner: Not on file  . Emotionally Abused: Not on file  . Physically Abused: Not on file  . Sexually Abused: Not on file    Not on File  Family History  Problem Relation Age of Onset  . Throat cancer Mother   . CAD Father   . CAD  Sister   . Cancer Sister   . Stroke Neg Hx     Prior to Admission medications   Medication Sig Start Date End Date Taking? Authorizing Provider  amLODipine (NORVASC) 5 MG tablet Take 1 tablet (5 mg total) by mouth daily. 01/28/20   Nche, Charlene Brooke, NP  carvedilol (COREG) 12.5 MG tablet Take 12.5 mg by mouth daily. 08/21/16 02/17/20  [provider]  cephALEXin (KEFLEX) 500 MG capsule Take 500 mg by mouth 4 (four) times daily. 01/22/20   [provider]  levothyroxine (LEVOXYL) 75 MCG tablet Take 1 tablet (75 mcg total) by mouth in the morning. 01/28/20   Nche, Charlene Brooke, NP  potassium chloride (KLOR-CON) 10 MEQ tablet Take 1 tablet (10 mEq total) by mouth in the morning. 01/28/20   Nche, Charlene Brooke, NP  valsartan-hydrochlorothiazide  (DIOVAN-HCT) 320-12.5 MG tablet Take 1 tablet by mouth daily. 01/28/20   Flossie Buffy, NP    Physical Exam: Vitals:   04/12/20 1345 04/12/20 1631 04/12/20 1800 04/12/20 1834  BP: 130/64  113/82   Pulse: 76  72   Resp: 20  18   Temp:    97.8 F (36.6 C)  TempSrc:    Oral  SpO2: 96% 96% 95%      . General:  Appears calm and comfortable and is NAD . Eyes:  PERRL, EOMI, normal lids, iris . ENT:  grossly normal hearing, lips & tongue, mmm; appropriate dentition . Neck:  no LAD, masses or thyromegaly; no carotid bruits . Cardiovascular:  RRR, no m/r/g. No LE edema.  Marland Kitchen Respiratory:   CTA bilaterally with no wheezes/rales/rhonchi.  Normal respiratory effort. . Abdomen:  soft, NT, ND, NABS . Back:   normal alignment, no CVAT . Skin:  no rash or induration seen on limited exam . Musculoskeletal:  grossly normal tone BUE/BLE, good ROM, no bony abnormality . Psychiatric:  grossly normal mood and affect, speech fluent and appropriate, AOx3 . Neurologic:  CN 2-12 grossly intact, moves all extremities in coordinated fashion, sensation intact, still very dizzy with ambulation    Radiological Exams on Admission: CT Head Wo Contrast  Result Date: 04/12/2020 CLINICAL DATA:  Head trauma.  Near syncope EXAM: CT HEAD WITHOUT CONTRAST TECHNIQUE: Contiguous axial images were obtained from the base of the skull through the vertex without intravenous contrast. COMPARISON:  None. FINDINGS: Brain: No evidence of acute infarction, hemorrhage, hydrocephalus, extra-axial collection or mass lesion/mass effect. Scattered low-density changes within the periventricular and subcortical white matter compatible with chronic microvascular ischemic change. Mild diffuse cerebral volume loss. Vascular: Atherosclerotic calcifications involving the large vessels of the skull base. No unexpected hyperdense vessel. Skull: Normal. Negative for fracture or focal lesion. Sinuses/Orbits: No acute finding. Other: None.  IMPRESSION: 1. No acute intracranial findings. 2. Chronic microvascular ischemic change and cerebral volume loss. Electronically Signed   By: Davina Poke D.O.   On: 04/12/2020 11:09   MR BRAIN WO CONTRAST  Result Date: 04/12/2020 CLINICAL DATA:  Head trauma, near syncope. EXAM: MRI HEAD WITHOUT CONTRAST TECHNIQUE: Multiplanar, multiecho pulse sequences of the brain and surrounding structures were obtained without intravenous contrast. COMPARISON:  04/12/2020 head CT and prior. FINDINGS: Brain: Mild diffusion-weighted hyperintensity without focal ADC correlate involving the right parietal lobe (2:34). No intracranial hemorrhage. No midline shift, ventriculomegaly or extra-axial fluid collection. No mass lesion. Mild diffuse parenchymal volume loss with ex vacuo dilatation. Mild chronic microvascular ischemic changes. Vascular: The right V4 segment is not well visualized. It may terminate as  PICA or be diminutive. Otherwise grossly preserved major intracranial vessels. Skull and upper cervical spine: Normal marrow signal. Sinuses/Orbits: Sequela of bilateral lens replacement. Normal orbits. Mild ethmoid sinus mucosal thickening. Trace left mastoid free fluid. Other: None. IMPRESSION: Mild right parietal DWI hyperintensity concerning for subacute infarct. Mild cerebral atrophy and chronic microvascular ischemic changes. These results were called by telephone at the time of interpretation on 04/12/2020 at 2:38 pm to provider DAN FLOYD , who verbally acknowledged these results. Electronically Signed   By: Primitivo Gauze M.D.   On: 04/12/2020 14:39   CT ABDOMEN PELVIS W CONTRAST  Result Date: 04/12/2020 CLINICAL DATA:  86 year old female with history of right lower quadrant abdominal pain. EXAM: CT ABDOMEN AND PELVIS WITH CONTRAST TECHNIQUE: Multidetector CT imaging of the abdomen and pelvis was performed using the standard protocol following bolus administration of intravenous contrast. CONTRAST:   160mL OMNIPAQUE IOHEXOL 350 MG/ML SOLN COMPARISON:  CT the abdomen and pelvis 01/21/2020. FINDINGS: Lower chest: Atherosclerotic calcifications in the descending thoracic aorta as well as the left main, left anterior descending, left circumflex and right coronary arteries. Thickening calcification of the aortic valve. Hepatobiliary: No suspicious cystic or solid hepatic lesions. No intra or extrahepatic biliary ductal dilatation. Status post cholecystectomy. Pancreas: No pancreatic mass. No pancreatic ductal dilatation. No pancreatic or peripancreatic fluid collections or inflammatory changes. Spleen: Unremarkable. Adrenals/Urinary Tract: Mild multifocal cortical thinning in the kidneys bilaterally. No suspicious renal lesions. Bilateral adrenal glands are normal in appearance. No hydroureteronephrosis. Urinary bladder is moderately distended, but otherwise unremarkable in appearance. Stomach/Bowel: Normal appearance of the stomach. No pathologic dilatation of small bowel or colon. Large periampullary duodenal diverticulum again noted, without surrounding inflammatory changes to suggest an associated diverticulitis. Status post right hemicolectomy. Vascular/Lymphatic: Aortic atherosclerosis, without evidence of aneurysm or dissection in the abdominal or pelvic vasculature. No lymphadenopathy noted in the abdomen or pelvis. Reproductive: Status post hysterectomy. Ovaries are not confidently identified may be surgically absent or atrophic. Other: No significant volume of ascites.  No pneumoperitoneum. Musculoskeletal: Chronic appearing compression fracture of L1 with 40% loss of anterior vertebral body height, similar to the prior study. There are no aggressive appearing lytic or blastic lesions noted in the visualized portions of the skeleton. IMPRESSION: 1. No acute findings are noted in the abdomen or pelvis to account for the patient's symptoms. 2. Status post right hemicolectomy and cholecystectomy. 3. Aortic  atherosclerosis, in addition to left main and 3 vessel coronary artery disease. 4. There are calcifications of the aortic valve. Echocardiographic correlation for evaluation of potential valvular dysfunction may be warranted if clinically indicated. 5. Additional incidental findings, as above. Electronically Signed   By: Vinnie Langton M.D.   On: 04/12/2020 11:18   DG Chest Port 1 View  Result Date: 04/12/2020 CLINICAL DATA:  Epigastric pain EXAM: PORTABLE CHEST 1 VIEW COMPARISON:  None. FINDINGS: Likely chronic mild interstitial prominence. No consolidation. No pleural effusion or pneumothorax. Cardiomediastinal contours are within normal limits for portable technique. There is calcified plaque along the thoracic aorta. Included upper abdomen is unremarkable apart from cholecystectomy clips. IMPRESSION: No acute process. Electronically Signed   By: Macy Mis M.D.   On: 04/12/2020 09:43    EKG: Independently reviewed.  NSR with rate 84; RBBB; nonspecific ST changes with no evidence of acute ischemia   Labs on Admission: I have personally reviewed the available labs and imaging studies at the time of the admission.  Pertinent labs:   Glucose 170 GFR 57 HS troponin 12,  21 WBC 13.3 TSH 5.333; 10.02 on 7/28   Assessment/Plan Principal Problem:   Acute CVA (cerebrovascular accident) [facility]) Active Problems:   Essential hypertension   Hypothyroidism (acquired)   CVA -Patient with several days of progressive neurologic symptoms from blurred vision to left facial weakness to severe dizziness  -Concerning for TIA/CVA -ABCD2 score indicates intermediate stroke risk -Aspirin has been given to reduce stroke mortality and decrease morbidity -Will place in observation status for CVA/TIA evaluation -Telemetry monitoring -MRI with subacute CVA; CTA head/neck pending -Echo -If the patient does not have known afib and this is not detected on telemetry during hospitalization, consider  outpatient Holter monitoring and/or loop recorder placement. -Risk stratification with FLP, A1c -Patient will need DAPT for 21 days when ABCD2 score is at least 4 and NIH score is 3 or less, and then can transition to monotherapy with a single antiplatelet agent.   Will defer to neurology for now. -Neurology consult -PT/OT/ST/Nutrition Consults -Meclizine as needed for dizziness due to possible BPPV component, and she may benefit from outpatient vestibular rehab -Will start Lipitor 40 mg daily -Elevated HS troponin with negative delta  HTN -Allow permissive HTN for now -Treat BP only if >220/120, and then with goal of 15% reduction -Hold Norvasc, Coreg, Diovan/HCTZ and plan to restart in 48-72 hours   Hypothyroidism -Mildly elevated TSH -Will increase Synthroid to 100 mcg daily     Note: This patient has been tested and is negative for the novel coronavirus COVID-19. She has been fully vaccinated against COVID-19.    DVT prophylaxis:  Lovenox  Code Status: DNR - confirmed with patient Family Communication: None present; I spoke with her daughter at the time of admission Disposition Plan:  The patient is from: home  Anticipated d/c is to: home, possibly with [facility] services   Anticipated d/c date will depend on clinical response to treatment, but possibly as early as tomorrow if she has excellent response to treatment  Patient is currently: acutely ill Consults called: Neurology; PT/OT/ST/Nutrition Admission status: It is my clinical opinion that referral for OBSERVATION is reasonable and necessary in this patient based on the above information provided. The aforementioned taken together are felt to place the patient at high risk for further clinical deterioration. However it is anticipated that the patient may be medically stable for discharge from the hospital within 24 to 48 hours.    Karmen Bongo MD [facility]   How to contact the [facility] Attending or Consulting provider  [place] or covering provider during after hours [place], for this patient?  1. Check the care team in [facility] and look for a) attending/consulting TRH provider listed and b) the [facility] team listed 2. Log into [email or web address] and use [place]'s universal password to access. If you do not have the password, please contact the hospital operator. 3. Locate the [facility] provider you are looking for under [facility] and page to a number that you can be directly reached. 4. If you still have difficulty reaching the provider, please page the [facility] (Director on Call) for the Hospitalists listed on amion for assistance.   04/12/2020, 7:13 PM

## 2020-04-12 NOTE — Telephone Encounter (Signed)
Patient daughter called and wanted to let Baldo Ash know that she was admitted to the [facility] and still try to determine diagnosis for patient.

## 2020-04-13 ENCOUNTER — Observation Stay (HOSPITAL_BASED_OUTPATIENT_CLINIC_OR_DEPARTMENT_OTHER): Payer: Medicare Other

## 2020-04-13 ENCOUNTER — Other Ambulatory Visit: Payer: Self-pay | Admitting: Cardiology

## 2020-04-13 DIAGNOSIS — I351 Nonrheumatic aortic (valve) insufficiency: Secondary | ICD-10-CM

## 2020-04-13 DIAGNOSIS — I639 Cerebral infarction, unspecified: Secondary | ICD-10-CM | POA: Diagnosis not present

## 2020-04-13 DIAGNOSIS — I6389 Other cerebral infarction: Secondary | ICD-10-CM

## 2020-04-13 DIAGNOSIS — H8111 Benign paroxysmal vertigo, right ear: Secondary | ICD-10-CM | POA: Diagnosis not present

## 2020-04-13 LAB — HEMOGLOBIN A1C
Hgb A1c MFr Bld: 5.6 % (ref 4.8–5.6)
Mean Plasma Glucose: 114.02 mg/dL

## 2020-04-13 LAB — LIPID PANEL
Cholesterol: 159 mg/dL (ref 0–200)
HDL: 58 mg/dL (ref >40)
LDL Cholesterol: 84 mg/dL (ref 0–99)
Total CHOL/HDL Ratio: 2.7 RATIO
Triglycerides: 87 mg/dL (ref <150)
VLDL: 17 mg/dL (ref 0–40)

## 2020-04-13 LAB — ECHOCARDIOGRAM COMPLETE
Area-P 1/2: 2.45 cm2
Height: 60 in
P 1/2 time: 566 msec
S' Lateral: 2.46 cm
Weight: 2366.86 oz

## 2020-04-13 MED ORDER — CLOPIDOGREL BISULFATE 75 MG PO TABS
75.0000 mg | ORAL_TABLET | Freq: Every day | ORAL | Status: DC
  Administered 2020-04-13: 75 mg via ORAL
  Filled 2020-04-13: qty 1

## 2020-04-13 MED ORDER — AMLODIPINE BESYLATE 10 MG PO TABS: 10.0000 mg | ORAL_TABLET | Freq: Every day | ORAL | 0 refills | Status: DC

## 2020-04-13 MED ORDER — CLOPIDOGREL BISULFATE 75 MG PO TABS
75.0000 mg | ORAL_TABLET | Freq: Every day | ORAL | 0 refills | Status: AC
Start: 2020-04-14 — End: 2020-05-05

## 2020-04-13 MED ORDER — ASPIRIN EC 81 MG PO TBEC
81.0000 mg | DELAYED_RELEASE_TABLET | Freq: Every day | ORAL | Status: DC
  Administered 2020-04-13: 81 mg via ORAL
  Filled 2020-04-13: qty 1

## 2020-04-13 MED ORDER — ATORVASTATIN CALCIUM 40 MG PO TABS
40.0000 mg | ORAL_TABLET | Freq: Every day | ORAL | 0 refills | Status: DC
Start: 2020-04-14 — End: 2021-09-12

## 2020-04-13 MED ORDER — INFLUENZA VAC A&B SA ADJ QUAD 0.5 ML IM PRSY: 0.5000 mL | PREFILLED_SYRINGE | INTRAMUSCULAR | Status: DC

## 2020-04-13 MED ORDER — ASPIRIN 81 MG PO TBEC
81.0000 mg | DELAYED_RELEASE_TABLET | Freq: Every day | ORAL | 11 refills | Status: DC
Start: 2020-04-14 — End: 2024-03-11

## 2020-04-13 NOTE — Progress Notes (Signed)
OT Cancellation Note  Patient Details Name: Manaia Samad MRN: [id number] DOB: Apr 05, 1925   Cancelled Treatment:    Reason Eval/Treat Not Completed: (P) Patient at procedure or test/ unavailable (working with [place]. Will return later. )  Tienna Bienkowski,HILLARY 04/13/2020, 10:54 AM  Maurie Boettcher, OT/L   Acute OT Clinical Specialist Acute Rehabilitation Services Pager [phone] Office [phone]

## 2020-04-13 NOTE — TOC Transition Note (Signed)
Transition of Care [facility]) - CM/SW Discharge Note   Patient Details  Name: Shannon Morales MRN: [id number] Date of Birth: April 04, 1925  [facility] CM/SW Contact:  Carles Collet, RN Phone Number: 04/13/2020, 1:47 PM   Clinical Narrative:   Spoke to patient and son at bedside. They would like [place] services, no preference for provider. [facility] accepted for PT OT SLP. Declined DME. No other CM needs identified.     Final next level of care: [place] Barriers to Discharge: No Barriers Identified   Patient Goals and CMS Choice Patient states their goals for this hospitalization and ongoing recovery are:: to go home CMS Medicare.gov Compare Post Acute Care list provided to:: Patient Choice offered to / list presented to : Patient  Discharge Placement                       Discharge Plan and Services                DME Arranged: N/A         HH Arranged: PT, OT, Speech Therapy HH Agency: [place] ([facility]) Date HH Agency Contacted: 04/13/20 Time [place]: 1347 Representative spoke with at [place]: [place] ([place]) Interventions     Readmission Risk Interventions No flowsheet data found.

## 2020-04-13 NOTE — Discharge Summary (Signed)
Physician Discharge Summary  Shannon Morales SWN:[id number] DOB: [birth year] DOA: 04/12/2020  PCP: Flossie Buffy, NP  Admit date: 04/12/2020 Discharge date: 04/13/2020  Admitted From: Home Disposition: Home  Recommendations for Outpatient Follow-up:  1. Follow up with PCP in 1-2 weeks 2. Follow with neurology in 4 to 6 weeks 3. Please obtain BMP/CBC in one week 4. Please follow up with your PCP on the following pending results: Unresulted Labs (From admission, onward)         None       Home Health: Yes Equipment/Devices: None  Discharge Condition: Stable CODE STATUS: DNR Diet recommendation: Cardiac  Subjective: Seen and examined this morning.  Felt completely back to normal without having symptoms and wanted to go home.  HPI: Shannon Morales is a 86 y.o. female with medical history significant of HTN and hypothyroidism presenting with near syncope.  She got up overnight and was very dizzy.  She went to the bathroom and fell into the tub.  She laid there until she was able to get herself back to bed and then called for help.  She felt well all day yesterday.  +blurry vision "lately", maybe a couple of days.  She does note some left-sided facial numbness yesterday that is improved today.   ED Course:  Near syncope this AM - no LOC.  Normal CT but persistent dizziness.  MRI with subacute R parietal infarct.  Neurology consult pending.  Brief/Interim Summary: Patient was admitted under hospital service with acute CVA.  CT head followed by MRI brain was done which showed mild right parietal DWI hyperintensity concerning for subacute infarct.  CTA of the head and neck was done which ruled out any LVO.  Echo was done which showed normal ejection fraction with no wall motion abnormality, no PFO.  Neurology saw patient.  She was started on aspirin, Plavix and atorvastatin.  They recommended 30-day external monitor for which I have contacted and notified cardiology who will arrange this as  outpatient.  Neurology cleared the patient for discharge.  Patient was seen by PT OT.  They cleared her with recommendations for home health as well.  There was some concern of a BPPV expressed by the neurologist yesterday however when I saw her this morning, she had no symptoms of dizziness and no nystagmus on my exam.  She was tested for vestibular therapies by PT and she did not have any symptoms either.  They did not recommend any further outpatient vestibular therapies.  She is being discharged in stable condition and will continue DAPT with Plavix for only 3 weeks and aspirin indefinitely.  Follow-up with PCP and neurology.  Discharge Diagnoses:  Principal Problem:   Acute CVA (cerebrovascular accident) [facility]) Active Problems:   Essential hypertension   Hypothyroidism (acquired)    Discharge Instructions   Allergies as of 04/13/2020   No Known Allergies     Medication List    TAKE these medications   amLODipine 10 MG tablet Commonly known as: NORVASC Take 1 tablet (10 mg total) by mouth daily. What changed:   medication strength  how much to take   aspirin 81 MG EC tablet Take 1 tablet (81 mg total) by mouth daily. Swallow whole. Start taking on: April 14, 2020   atorvastatin 40 MG tablet Commonly known as: LIPITOR Take 1 tablet (40 mg total) by mouth daily. Start taking on: April 14, 2020   clopidogrel 75 MG tablet Commonly known as: PLAVIX Take 1 tablet (75 mg total) by  mouth daily for 21 days. Start taking on: April 14, 2020   levothyroxine 75 MCG tablet Commonly known as: Levoxyl Take 1 tablet (75 mcg total) by mouth in the morning.   potassium chloride 10 MEQ tablet Commonly known as: KLOR-CON Take 1 tablet (10 mEq total) by mouth in the morning.   valsartan-hydrochlorothiazide 320-12.5 MG tablet Commonly known as: DIOVAN-HCT Take 1 tablet by mouth daily.            Durable Medical Equipment  (From admission, onward)         Start      Ordered   04/13/20 1106  For home use only DME Walker rolling  Once       Question Answer Comment  Walker: With [place]   Patient needs a walker to treat with the following condition Stroke [facility])      04/13/20 1105          Follow-up Information    Nche, Charlene Brooke, NP Follow up in 1 week(s).   Specialty: Internal Medicine Contact information: [place] [phone]        Garvin Fila, MD Follow up in 6 week(s).   Specialties: Neurology, Radiology Contact information: [address] [place] [address] [phone]              No Known Allergies  Consultations: Neurology   Procedures/Studies: CT ANGIO HEAD W OR WO CONTRAST  Result Date: 04/12/2020 CLINICAL DATA:  86 year old female with suspected small cortical subacute infarct in the posterior right MCA territory on MRI earlier today. Near syncope, head trauma. EXAM: CT ANGIOGRAPHY HEAD AND NECK TECHNIQUE: Multidetector CT imaging of the head and neck was performed using the standard protocol during bolus administration of intravenous contrast. Multiplanar CT image reconstructions and MIPs were obtained to evaluate the vascular anatomy. Carotid stenosis measurements (when applicable) are obtained utilizing NASCET criteria, using the distal internal carotid diameter as the denominator. CONTRAST:  42mL OMNIPAQUE IOHEXOL 350 MG/ML SOLN COMPARISON:  Brain MRI and head CT earlier today. FINDINGS: CTA NECK Skeleton: Age indeterminate but probably chronic mild T3 superior endplate compression. No acute osseous abnormality identified. Upper chest: Mild scarring in the superior segment of the right lower lobe, otherwise negative. Other neck: Negative. Aortic arch: Calcified aortic atherosclerosis. 3 vessel arch configuration. Right carotid system: Mildly tortuous brachiocephalic artery without stenosis. Normal right CCA origin. Tortuous proximal right CCA with a  partially retropharyngeal course. Bulky calcified plaque at the right carotid bifurcation although more affecting the ECA origin. Proximal right ICA stenosis is less than 50 % with respect to the distal vessel (series 9, image 68). Left carotid system: Normal left CCA origin. Calcified plaque at the left carotid bifurcation, although no proximal left ICA stenosis. Tortuous left ICA distal to the bulb. Vertebral arteries: Mild plaque in the proximal right subclavian artery without stenosis. Plaque at the right vertebral artery origin results in mild stenosis on series 8, image 147. The right vertebral artery is non dominant and otherwise normal to the skull base. Moderate soft and calcified plaque in the proximal left subclavian artery without significant stenosis. Calcified plaque at the posterior left vertebral artery origin results in only mild stenosis (series 9, image 116). Tortuous left V1 segment. Dominant left vertebral artery is tortuous but patent to the skull base without stenosis. CTA HEAD Posterior circulation: The non dominant right vertebral artery functionally terminates in PICA. Dominant left vertebral supplies the basilar with mild  plaque and no stenosis. Normal left PICA origin. Patent basilar artery with tortuosity but no stenosis. Normal SCA and PCA origins. Posterior communicating arteries are diminutive or absent. Mild left P2 segment irregularity and stenosis. Left PCA branches are otherwise within normal limits. But there is severe right P2 segment stenosis (series 12, image 17 and series 10, image 18). The right PCA remains patent, other PCA branches are within normal limits. Anterior circulation: Both ICA siphons are patent. Moderate calcified plaque on the left only results in mild stenosis. Normal left ophthalmic artery origin. Moderate calcified plaque on the right results in mild stenosis which is most pronounced in the supraclinoid segment. Normal right ophthalmic artery origin. Patent  carotid termini. Normal MCA and ACA origins. Normal anterior communicating artery and bilateral ACA branches. Left MCA M1 segment and bifurcation are patent without stenosis. Left MCA branches are within normal limits. Right MCA M1 segment and bifurcation are patent without stenosis. The right MCA posterior division is mildly ectatic without discrete aneurysm (series 10, image 18 and series 11, image 20). Right MCA branches are otherwise within normal limits. Venous sinuses: Patent. Anatomic variants: Dominant left vertebral artery, the right is diminutive and functionally terminates in PICA. Review of the MIP images confirms the above findings IMPRESSION: 1. Negative for large vessel occlusion. 2. Positive for Severe stenosis of the Right PCA P2 segment. 3. Calcified carotid atherosclerosis with only mild stenosis. Dominant left vertebral artery with only mild origin stenosis. 4. Aortic Atherosclerosis (ICD10-I70.0). Electronically Signed   By: Genevie Ann M.D.   On: 04/12/2020 19:42   CT Head Wo Contrast  Result Date: 04/12/2020 CLINICAL DATA:  Head trauma.  Near syncope EXAM: CT HEAD WITHOUT CONTRAST TECHNIQUE: Contiguous axial images were obtained from the base of the skull through the vertex without intravenous contrast. COMPARISON:  None. FINDINGS: Brain: No evidence of acute infarction, hemorrhage, hydrocephalus, extra-axial collection or mass lesion/mass effect. Scattered low-density changes within the periventricular and subcortical white matter compatible with chronic microvascular ischemic change. Mild diffuse cerebral volume loss. Vascular: Atherosclerotic calcifications involving the large vessels of the skull base. No unexpected hyperdense vessel. Skull: Normal. Negative for fracture or focal lesion. Sinuses/Orbits: No acute finding. Other: None. IMPRESSION: 1. No acute intracranial findings. 2. Chronic microvascular ischemic change and cerebral volume loss. Electronically Signed   By: Davina Poke D.O.   On: 04/12/2020 11:09   CT ANGIO NECK W OR WO CONTRAST  Result Date: 04/12/2020 CLINICAL DATA:  86 year old female with suspected small cortical subacute infarct in the posterior right MCA territory on MRI earlier today. Near syncope, head trauma. EXAM: CT ANGIOGRAPHY HEAD AND NECK TECHNIQUE: Multidetector CT imaging of the head and neck was performed using the standard protocol during bolus administration of intravenous contrast. Multiplanar CT image reconstructions and MIPs were obtained to evaluate the vascular anatomy. Carotid stenosis measurements (when applicable) are obtained utilizing NASCET criteria, using the distal internal carotid diameter as the denominator. CONTRAST:  41mL OMNIPAQUE IOHEXOL 350 MG/ML SOLN COMPARISON:  Brain MRI and head CT earlier today. FINDINGS: CTA NECK Skeleton: Age indeterminate but probably chronic mild T3 superior endplate compression. No acute osseous abnormality identified. Upper chest: Mild scarring in the superior segment of the right lower lobe, otherwise negative. Other neck: Negative. Aortic arch: Calcified aortic atherosclerosis. 3 vessel arch configuration. Right carotid system: Mildly tortuous brachiocephalic artery without stenosis. Normal right CCA origin. Tortuous proximal right CCA with a partially retropharyngeal course. Bulky calcified plaque at the right carotid bifurcation although  more affecting the ECA origin. Proximal right ICA stenosis is less than 50 % with respect to the distal vessel (series 9, image 68). Left carotid system: Normal left CCA origin. Calcified plaque at the left carotid bifurcation, although no proximal left ICA stenosis. Tortuous left ICA distal to the bulb. Vertebral arteries: Mild plaque in the proximal right subclavian artery without stenosis. Plaque at the right vertebral artery origin results in mild stenosis on series 8, image 147. The right vertebral artery is non dominant and otherwise normal to the skull base.  Moderate soft and calcified plaque in the proximal left subclavian artery without significant stenosis. Calcified plaque at the posterior left vertebral artery origin results in only mild stenosis (series 9, image 116). Tortuous left V1 segment. Dominant left vertebral artery is tortuous but patent to the skull base without stenosis. CTA HEAD Posterior circulation: The non dominant right vertebral artery functionally terminates in PICA. Dominant left vertebral supplies the basilar with mild plaque and no stenosis. Normal left PICA origin. Patent basilar artery with tortuosity but no stenosis. Normal SCA and PCA origins. Posterior communicating arteries are diminutive or absent. Mild left P2 segment irregularity and stenosis. Left PCA branches are otherwise within normal limits. But there is severe right P2 segment stenosis (series 12, image 17 and series 10, image 18). The right PCA remains patent, other PCA branches are within normal limits. Anterior circulation: Both ICA siphons are patent. Moderate calcified plaque on the left only results in mild stenosis. Normal left ophthalmic artery origin. Moderate calcified plaque on the right results in mild stenosis which is most pronounced in the supraclinoid segment. Normal right ophthalmic artery origin. Patent carotid termini. Normal MCA and ACA origins. Normal anterior communicating artery and bilateral ACA branches. Left MCA M1 segment and bifurcation are patent without stenosis. Left MCA branches are within normal limits. Right MCA M1 segment and bifurcation are patent without stenosis. The right MCA posterior division is mildly ectatic without discrete aneurysm (series 10, image 18 and series 11, image 20). Right MCA branches are otherwise within normal limits. Venous sinuses: Patent. Anatomic variants: Dominant left vertebral artery, the right is diminutive and functionally terminates in PICA. Review of the MIP images confirms the above findings IMPRESSION: 1.  Negative for large vessel occlusion. 2. Positive for Severe stenosis of the Right PCA P2 segment. 3. Calcified carotid atherosclerosis with only mild stenosis. Dominant left vertebral artery with only mild origin stenosis. 4. Aortic Atherosclerosis (ICD10-I70.0). Electronically Signed   By: Genevie Ann M.D.   On: 04/12/2020 19:42   MR BRAIN WO CONTRAST  Result Date: 04/12/2020 CLINICAL DATA:  Head trauma, near syncope. EXAM: MRI HEAD WITHOUT CONTRAST TECHNIQUE: Multiplanar, multiecho pulse sequences of the brain and surrounding structures were obtained without intravenous contrast. COMPARISON:  04/12/2020 head CT and prior. FINDINGS: Brain: Mild diffusion-weighted hyperintensity without focal ADC correlate involving the right parietal lobe (2:34). No intracranial hemorrhage. No midline shift, ventriculomegaly or extra-axial fluid collection. No mass lesion. Mild diffuse parenchymal volume loss with ex vacuo dilatation. Mild chronic microvascular ischemic changes. Vascular: The right V4 segment is not well visualized. It may terminate as PICA or be diminutive. Otherwise grossly preserved major intracranial vessels. Skull and upper cervical spine: Normal marrow signal. Sinuses/Orbits: Sequela of bilateral lens replacement. Normal orbits. Mild ethmoid sinus mucosal thickening. Trace left mastoid free fluid. Other: None. IMPRESSION: Mild right parietal DWI hyperintensity concerning for subacute infarct. Mild cerebral atrophy and chronic microvascular ischemic changes. These results were called by telephone at  the time of interpretation on 04/12/2020 at 2:38 pm to provider DAN FLOYD , who verbally acknowledged these results. Electronically Signed   By: Primitivo Gauze M.D.   On: 04/12/2020 14:39   CT ABDOMEN PELVIS W CONTRAST  Result Date: 04/12/2020 CLINICAL DATA:  86 year old female with history of right lower quadrant abdominal pain. EXAM: CT ABDOMEN AND PELVIS WITH CONTRAST TECHNIQUE: Multidetector CT  imaging of the abdomen and pelvis was performed using the standard protocol following bolus administration of intravenous contrast. CONTRAST:  129mL OMNIPAQUE IOHEXOL 350 MG/ML SOLN COMPARISON:  CT the abdomen and pelvis 01/21/2020. FINDINGS: Lower chest: Atherosclerotic calcifications in the descending thoracic aorta as well as the left main, left anterior descending, left circumflex and right coronary arteries. Thickening calcification of the aortic valve. Hepatobiliary: No suspicious cystic or solid hepatic lesions. No intra or extrahepatic biliary ductal dilatation. Status post cholecystectomy. Pancreas: No pancreatic mass. No pancreatic ductal dilatation. No pancreatic or peripancreatic fluid collections or inflammatory changes. Spleen: Unremarkable. Adrenals/Urinary Tract: Mild multifocal cortical thinning in the kidneys bilaterally. No suspicious renal lesions. Bilateral adrenal glands are normal in appearance. No hydroureteronephrosis. Urinary bladder is moderately distended, but otherwise unremarkable in appearance. Stomach/Bowel: Normal appearance of the stomach. No pathologic dilatation of small bowel or colon. Large periampullary duodenal diverticulum again noted, without surrounding inflammatory changes to suggest an associated diverticulitis. Status post right hemicolectomy. Vascular/Lymphatic: Aortic atherosclerosis, without evidence of aneurysm or dissection in the abdominal or pelvic vasculature. No lymphadenopathy noted in the abdomen or pelvis. Reproductive: Status post hysterectomy. Ovaries are not confidently identified may be surgically absent or atrophic. Other: No significant volume of ascites.  No pneumoperitoneum. Musculoskeletal: Chronic appearing compression fracture of L1 with 40% loss of anterior vertebral body height, similar to the prior study. There are no aggressive appearing lytic or blastic lesions noted in the visualized portions of the skeleton. IMPRESSION: 1. No acute findings  are noted in the abdomen or pelvis to account for the patient's symptoms. 2. Status post right hemicolectomy and cholecystectomy. 3. Aortic atherosclerosis, in addition to left main and 3 vessel coronary artery disease. 4. There are calcifications of the aortic valve. Echocardiographic correlation for evaluation of potential valvular dysfunction may be warranted if clinically indicated. 5. Additional incidental findings, as above. Electronically Signed   By: Vinnie Langton M.D.   On: 04/12/2020 11:18   DG Chest Port 1 View  Result Date: 04/12/2020 CLINICAL DATA:  Epigastric pain EXAM: PORTABLE CHEST 1 VIEW COMPARISON:  None. FINDINGS: Likely chronic mild interstitial prominence. No consolidation. No pleural effusion or pneumothorax. Cardiomediastinal contours are within normal limits for portable technique. There is calcified plaque along the thoracic aorta. Included upper abdomen is unremarkable apart from cholecystectomy clips. IMPRESSION: No acute process. Electronically Signed   By: Macy Mis M.D.   On: 04/12/2020 09:43   ECHOCARDIOGRAM COMPLETE  Result Date: 04/13/2020    ECHOCARDIOGRAM REPORT   Patient Name:   CHELSIE BUREL Date of Exam: 04/13/2020 Medical Rec #:  [id number]   Height:       60.0 in Accession #:    [id number]  Weight:       147.9 lb Date of Birth:  19-Oct-1924   BSA:          1.642 m Patient Age:    33 years    BP:           151/64 mmHg Patient Gender: F           HR:  77 bpm. Exam Location:  Inpatient Procedure: 2D Echo, Cardiac Doppler and Color Doppler Indications:    Stroke 434.91 / I163.9  History:        Patient has no prior history of Echocardiogram examinations.                 Risk Factors:Hypertension. Hypothyroidism.  Sonographer:    Jonelle Sidle Dance Referring Phys: Brandon  1. Left ventricular ejection fraction, by estimation, is 65 to 70%. The left ventricle has normal function. The left ventricle has no regional wall motion abnormalities.  There is severe left ventricular hypertrophy of the basal-septum and mild-to-moderate LVH of the rest of the LV segments. Diastology consistent with elevated LV filling pressures with E/E' 20.  2. Right ventricular systolic function is normal. The right ventricular size is normal.  3. The mitral valve is normal in structure. Trivial mitral valve regurgitation.  4. The aortic valve is tricuspid. There is mild calcification of the aortic valve. There is mild thickening of the aortic valve. Aortic valve regurgitation is mild. Mild to moderate aortic valve sclerosis/calcification is present, without any evidence of aortic stenosis. Comparison(s): No prior Echocardiogram. Conclusion(s)/Recommendation(s): No intracardiac source of embolism detected on this transthoracic study. A transesophageal echocardiogram is recommended to exclude cardiac source of embolism if clinically indicated. FINDINGS  Left Ventricle: Left ventricular ejection fraction, by estimation, is 65 to 70%. The left ventricle has normal function. The left ventricle has no regional wall motion abnormalities. The left ventricular internal cavity size was normal in size. There is  severe left ventricular hypertrophy of the basal-septum and mild-to-moderate LVH of the rest of the LV segments. Diastology consistent with elevated LV filling pressures with E/E' 20. Right Ventricle: The right ventricular size is normal. Right vetricular wall thickness was not well visualized. Right ventricular systolic function is normal. Left Atrium: Left atrial size was normal in size. Right Atrium: Right atrial size was normal in size. Pericardium: There is no evidence of pericardial effusion. Mitral Valve: The mitral valve is normal in structure. There is mild thickening of the mitral valve leaflet(s). Mild mitral annular calcification. Trivial mitral valve regurgitation. Tricuspid Valve: The tricuspid valve is normal in structure. Tricuspid valve regurgitation is trivial.  Aortic Valve: The aortic valve is tricuspid. There is mild calcification of the aortic valve. There is mild thickening of the aortic valve. There is mild aortic valve annular calcification. Aortic valve regurgitation is mild. Aortic regurgitation PHT measures 566 msec. Mild to moderate aortic valve sclerosis/calcification is present, without any evidence of aortic stenosis. Pulmonic Valve: The pulmonic valve was normal in structure. Pulmonic valve regurgitation is mild. Aorta: The aortic root and ascending aorta are structurally normal, with no evidence of dilitation. IAS/Shunts: No atrial level shunt detected by color flow Doppler.  LEFT VENTRICLE PLAX 2D LVIDd:         3.44 cm  Diastology LVIDs:         2.46 cm  LV e' medial:    4.46 cm/s LV PW:         1.19 cm  LV E/e' medial:  20.2 LV IVS:        1.33 cm  LV e' lateral:   6.09 cm/s LVOT diam:     1.40 cm  LV E/e' lateral: 14.8 LV SV:         40 LV SV Index:   25 LVOT Area:     1.54 cm  RIGHT VENTRICLE  IVC RV Basal diam:  2.00 cm     IVC diam: 1.52 cm RV S prime:     15.70 cm/s TAPSE (M-mode): 1.3 cm LEFT ATRIUM             Index       RIGHT ATRIUM          Index LA diam:        3.20 cm 1.95 cm/m  RA Area:     9.79 cm LA Vol (A2C):   51.3 ml 31.24 ml/m RA Volume:   19.60 ml 11.94 ml/m LA Vol (A4C):   40.9 ml 24.91 ml/m LA Biplane Vol: 48.1 ml 29.29 ml/m  AORTIC VALVE LVOT Vmax:   111.00 cm/s LVOT Vmean:  80.400 cm/s LVOT VTI:    0.262 m AI PHT:      566 msec  AORTA Ao Root diam: 3.30 cm Ao Asc diam:  3.50 cm MITRAL VALVE MV Area (PHT): 2.45 cm     SHUNTS MV Decel Time: 310 msec     Systemic VTI:  0.26 m MV E velocity: 90.00 cm/s   Systemic Diam: 1.40 cm MV A velocity: 138.00 cm/s MV E/A ratio:  0.65 Gwyndolyn Kaufman MD Electronically signed by Gwyndolyn Kaufman MD Signature Date/Time: 04/13/2020/10:57:08 AM    Final       Discharge Exam: Vitals:   04/13/20 0357 04/13/20 0757  BP: (!) 149/59 (!) 151/64  Pulse: 66 75  Resp: 16 18   Temp: 97.6 F (36.4 C) (!) 97.5 F (36.4 C)  SpO2: 96% 97%   Vitals:   04/12/20 2343 04/13/20 0200 04/13/20 0357 04/13/20 0757  BP: (!) 130/52 (!) 149/58 (!) 149/59 (!) 151/64  Pulse: 70 64 66 75  Resp: 17 18 16 18   Temp: 97.7 F (36.5 C) 97.9 F (36.6 C) 97.6 F (36.4 C) (!) 97.5 F (36.4 C)  TempSrc: Oral Oral Oral Oral  SpO2: 96% 97% 96% 97%  Weight:      Height:        General: Pt is alert, awake, not in acute distress Cardiovascular: RRR, S1/S2 +, no rubs, no gallops Respiratory: CTA bilaterally, no wheezing, no rhonchi Abdominal: Soft, NT, ND, bowel sounds + Extremities: no edema, no cyanosis    The results of significant diagnostics from this hospitalization (including imaging, microbiology, ancillary and laboratory) are listed below for reference.     Microbiology: Recent Results (from the past 240 hour(s))  Respiratory Panel by RT PCR (Flu A&B, Covid) - Nasopharyngeal Swab     Status: None   Collection Time: 04/12/20  4:20 PM   Specimen: Nasopharyngeal Swab  Result Value Ref Range Status   SARS Coronavirus 2 by RT PCR NEGATIVE NEGATIVE Final    Comment: (NOTE) SARS-CoV-2 target nucleic acids are NOT DETECTED.  The SARS-CoV-2 RNA is generally detectable in upper respiratoy specimens during the acute phase of infection. The lowest concentration of SARS-CoV-2 viral copies this assay can detect is 131 copies/mL. A negative result does not preclude SARS-Cov-2 infection and should not be used as the sole basis for treatment or other patient management decisions. A negative result may occur with  improper specimen collection/handling, submission of specimen other than nasopharyngeal swab, presence of viral mutation(s) within the areas targeted by this assay, and inadequate number of viral copies (<131 copies/mL). A negative result must be combined with clinical observations, patient history, and epidemiological information. The expected result is  Negative.  Fact Sheet for Patients:  PinkCheek.be  Fact Sheet  for Healthcare Providers:  GravelBags.it  This test is no t yet approved or cleared by the [place] and  has been authorized for detection and/or diagnosis of SARS-CoV-2 by FDA under an Emergency Use Authorization (EUA). This EUA will remain  in effect (meaning this test can be used) for the duration of the COVID-19 declaration under Section 564(b)(1) of the Act, 21 U.S.C. section 360bbb-3(b)(1), unless the authorization is terminated or revoked sooner.     Influenza A by PCR NEGATIVE NEGATIVE Final   Influenza B by PCR NEGATIVE NEGATIVE Final    Comment: (NOTE) The Xpert Xpress SARS-CoV-2/FLU/RSV assay is intended as an aid in  the diagnosis of influenza from Nasopharyngeal swab specimens and  should not be used as a sole basis for treatment. Nasal washings and  aspirates are unacceptable for Xpert Xpress SARS-CoV-2/FLU/RSV  testing.  Fact Sheet for Patients: PinkCheek.be  Fact Sheet for Healthcare Providers: GravelBags.it  This test is not yet approved or cleared by the [place] FDA and  has been authorized for detection and/or diagnosis of SARS-CoV-2 by  FDA under an Emergency Use Authorization (EUA). This EUA will remain  in effect (meaning this test can be used) for the duration of the  Covid-19 declaration under Section 564(b)(1) of the Act, 21  U.S.C. section 360bbb-3(b)(1), unless the authorization is  terminated or revoked. Performed at [facility], [place] [address] [place], [place] [address]      Labs: BNP (last 3 results) No results for input(s): BNP in the last 8760 hours. Basic Metabolic Panel: Recent Labs  Lab 04/12/20 0911 04/12/20 0927  NA 140 142  K 3.6 3.4*  CL 108 109  CO2 20*  --   GLUCOSE 170* 170*  BUN 15 17  CREATININE 0.88 0.80  CALCIUM 9.1   --    Liver Function Tests: Recent Labs  Lab 04/12/20 0911  AST 23  ALT 16  ALKPHOS 45  BILITOT 0.4  PROT 7.1  ALBUMIN 3.8   Recent Labs  Lab 04/12/20 0911  LIPASE 33   No results for input(s): AMMONIA in the last 168 hours. CBC: Recent Labs  Lab 04/12/20 0911 04/12/20 0927  WBC 13.3*  --   NEUTROABS 11.1*  --   HGB 13.5 12.9  HCT 41.3 38.0  MCV 93.2  --   PLT 260  --    Cardiac Enzymes: No results for input(s): CKTOTAL, CKMB, CKMBINDEX, TROPONINI in the last 168 hours. BNP: Invalid input(s): POCBNP CBG: No results for input(s): GLUCAP in the last 168 hours. D-Dimer No results for input(s): DDIMER in the last 72 hours. Hgb A1c Recent Labs    04/13/20 0104  HGBA1C 5.6   Lipid Profile Recent Labs    04/13/20 0104  CHOL 159  HDL 58  LDLCALC 84  TRIG 87  CHOLHDL 2.7   Thyroid function studies Recent Labs    04/12/20 0911  TSH 5.333*   Anemia work up No results for input(s): VITAMINB12, FOLATE, FERRITIN, TIBC, IRON, RETICCTPCT in the last 72 hours. Urinalysis    Component Value Date/Time   COLORURINE YELLOW 01/27/2020 1144   APPEARANCEUR CLEAR 01/27/2020 1144   LABSPEC 1.005 01/27/2020 1144   PHURINE 6.5 01/27/2020 1144   GLUCOSEU NEGATIVE 01/27/2020 1144   HGBUR NEGATIVE 01/27/2020 [place] 01/27/2020 1144   PROTEINUR NEGATIVE 01/27/2020 1144   Sepsis Labs Invalid input(s): PROCALCITONIN,  WBC,  LACTICIDVEN Microbiology Recent Results (from the past 240 hour(s))  Respiratory  Panel by RT PCR (Flu A&B, Covid) - Nasopharyngeal Swab     Status: None   Collection Time: 04/12/20  4:20 PM   Specimen: Nasopharyngeal Swab  Result Value Ref Range Status   SARS Coronavirus 2 by RT PCR NEGATIVE NEGATIVE Final    Comment: (NOTE) SARS-CoV-2 target nucleic acids are NOT DETECTED.  The SARS-CoV-2 RNA is generally detectable in upper respiratoy specimens during the acute phase of infection. The lowest concentration of SARS-CoV-2  viral copies this assay can detect is 131 copies/mL. A negative result does not preclude SARS-Cov-2 infection and should not be used as the sole basis for treatment or other patient management decisions. A negative result may occur with  improper specimen collection/handling, submission of specimen other than nasopharyngeal swab, presence of viral mutation(s) within the areas targeted by this assay, and inadequate number of viral copies (<131 copies/mL). A negative result must be combined with clinical observations, patient history, and epidemiological information. The expected result is Negative.  Fact Sheet for Patients:  PinkCheek.be  Fact Sheet for Healthcare Providers:  GravelBags.it  This test is no t yet approved or cleared by the [place] FDA and  has been authorized for detection and/or diagnosis of SARS-CoV-2 by FDA under an Emergency Use Authorization (EUA). This EUA will remain  in effect (meaning this test can be used) for the duration of the COVID-19 declaration under Section 564(b)(1) of the Act, 21 U.S.C. section 360bbb-3(b)(1), unless the authorization is terminated or revoked sooner.     Influenza A by PCR NEGATIVE NEGATIVE Final   Influenza B by PCR NEGATIVE NEGATIVE Final    Comment: (NOTE) The Xpert Xpress SARS-CoV-2/FLU/RSV assay is intended as an aid in  the diagnosis of influenza from Nasopharyngeal swab specimens and  should not be used as a sole basis for treatment. Nasal washings and  aspirates are unacceptable for Xpert Xpress SARS-CoV-2/FLU/RSV  testing.  Fact Sheet for Patients: PinkCheek.be  Fact Sheet for Healthcare Providers: GravelBags.it  This test is not yet approved or cleared by the [place] FDA and  has been authorized for detection and/or diagnosis of SARS-CoV-2 by  FDA under an Emergency Use Authorization (EUA).  This EUA will remain  in effect (meaning this test can be used) for the duration of the  Covid-19 declaration under Section 564(b)(1) of the Act, 21  U.S.C. section 360bbb-3(b)(1), unless the authorization is  terminated or revoked. Performed at [facility], [place] [address] [place], [place] [address]      Time coordinating discharge: Over 30 minutes  SIGNED:   Darliss Cheney, MD  [facility] 04/13/2020, 11:10 AM  If 7PM-7AM, please contact night-coverage [email or web address]

## 2020-04-13 NOTE — Progress Notes (Addendum)
Physical Therapy Treatment Patient Details Name: Shannon Morales MRN: [id number] DOB: Nov 11, 1924 Today's Date: 04/13/2020    History of Present Illness Pt is a 86 year old female who exsperienced a near syncope episode. She was trying to utilize the bathroom at night when she became dizzy upon standing from her bed and walked to the bathroom and then fell into the bathtub. She denied hitting her head or losing consciousness. She reported a headache and new blurred vision also. CT of head was negative. MRI revealed R parietal subacute infarct. NIH 0. Medical hx of hypothyroidism, HTN, and CVA.    PT Comments    Patient presents without signs of positional vertigo, though may have had previous hypofunction with positive R ear diminished hearing compared to R, and R head thrust test.  She reports one previous episode that also resolved on its own.  Educated pt and son in seeking outpatient vestibular PT if recurs and does not go away.  Also educated on safety with regards to balance to take it slow, have good lighting, footwear and clear pathway.  Patient and son verbalized understanding.  No further vestibular needs at this time.  Continue skilled PT in [facility] setting.    Vestibular Assessment - 04/13/20 0001      Symptom Behavior   Subjective history of current problem Patient and son concur she had one other previous episode of vertigo when had a UTI, but did not fall that time and both times episode resolved fairly quickly on its own.  She describes spinning along with R side tinnitus which also resolves.  Denies hitting head when she fell.  No recent hearing changes, though she is [facility] and wears hearing aides.     Type of Dizziness  Spinning    Frequency of Dizziness intermittent    Duration of Dizziness minutes    Symptom Nature Spontaneous;Intermittent;Motion provoked    Aggravating Factors Spontaneous onset;Turning head quickly;Activity in general    Relieving Factors Lying supine;Rest;Slow  movements    Progression of Symptoms Better    History of similar episodes yes, as noted above      Oculomotor Exam   Oculomotor Alignment Normal    Ocular ROM WNL    Spontaneous Absent    Gaze-induced  Absent    Head shaking Horizontal L beating nystagmus   only with looking L   Smooth Pursuits Intact    Saccades Intact      Oculomotor Exam-Fixation Suppressed    Left Head Impulse Negative     Right Head Impulse Positive for refixation      Vestibulo-Ocular Reflex   VOR 1 Head Only (x 1 viewing) cues for keeping target x 30 sec both vertical and horizontal      Auditory   Comments decreased to scratch test on R more than L      Positional Testing   Sidelying Test Sidelying Right;Sidelying Left    Horizontal Canal Testing Horizontal Canal Right;Horizontal Canal Left      Sidelying Right   Sidelying Right Duration 1 minute    Sidelying Right Symptoms No nystagmus      Sidelying Left   Sidelying Left Duration 1 minute    Sidelying Left Symptoms No nystagmus      Horizontal Canal Right   Horizontal Canal Right Duration 1 minute    Horizontal Canal Right Symptoms Normal      Horizontal Canal Left   Horizontal Canal Left Duration 1 minute    Horizontal Canal Left Symptoms  Normal           Follow Up Recommendations  Home health PT;Supervision for mobility/OOB     Equipment Recommendations  Rolling walker with 5" wheels;3in1 (PT)    Recommendations for Other Services       Precautions / Restrictions Precautions Precautions: Fall Precaution Comments: HOH    Mobility  Bed Mobility Overal bed mobility: Needs Assistance Bed Mobility: Sidelying to Sit   Sidelying to sit: Min assist       General bed mobility comments: assist for positioning for BPPV testing  Transfers Overall transfer level: Needs assistance     Sit to Stand: Min guard         General transfer comment: assist for balance  Ambulation/Gait Ambulation/Gait assistance: Min  guard Gait Distance (Feet): 10 Feet Assistive device: None Gait Pattern/deviations: Step-through pattern;Decreased stride length     General Gait Details: wobbly on the way back from bathroom to bed, assist for balance   Stairs             Wheelchair Mobility    Modified Rankin (Stroke Patients Only) Modified Rankin (Stroke Patients Only) Pre-Morbid Rankin Score: No symptoms Modified Rankin: Moderately severe disability     Balance Overall balance assessment: Needs assistance Sitting-balance support: No upper extremity supported Sitting balance-Leahy Scale: Normal Sitting balance - Comments: Pt able to sit EOB statically and dynamically without LOB.   Standing balance support: No upper extremity supported Standing balance-Leahy Scale: Fair Standing balance comment: standing to wash hands at sink with S, minguard reaching for paper towel                            Cognition Arousal/Alertness: Awake/alert Behavior During Therapy: Impulsive Overall Cognitive Status: Impaired/Different from baseline Area of Impairment: Attention;Safety/judgement;Awareness;Problem solving                   Current Attention Level: Selective     Safety/Judgement: Decreased awareness of deficits Awareness: Emergent Problem Solving: Slow processing        Exercises      General Comments General comments (skin integrity, edema, etc.): son in the room and pt and son educated in possible causes of vertigo and for future episodes to seek MD assistance for vestibular PT.      Pertinent Vitals/Pain Pain Assessment: No/denies pain    Home Living                      Prior Function            PT Goals (current goals can now be found in the care plan section) Progress towards PT goals: Progressing toward goals    Frequency    Min 3X/week      PT Plan Current plan remains appropriate    Co-evaluation              AM-PAC PT "6 Clicks"  Mobility   Outcome Measure  Help needed turning from your back to your side while in a flat bed without using bedrails?: None Help needed moving from lying on your back to sitting on the side of a flat bed without using bedrails?: None Help needed moving to and from a bed to a chair (including a wheelchair)?: A Little Help needed standing up from a chair using your arms (e.g., wheelchair or bedside chair)?: A Little Help needed to walk in hospital room?: A Little Help needed climbing 3-5 steps  with a railing? : A Little 6 Click Score: 20    End of Session   Activity Tolerance: Patient tolerated treatment well Patient left: in bed;with call bell/phone within reach;with family/visitor present Nurse Communication: Other (comment) (stable for d/c) PT Visit Diagnosis: Unsteadiness on feet (R26.81);Other abnormalities of gait and mobility (R26.89);Muscle weakness (generalized) (M62.81);History of falling (Z91.81);Difficulty in walking, not elsewhere classified (R26.2);Other symptoms and signs involving the nervous system (R29.898)     Time: 9458-5929 PT Time Calculation (min) (ACUTE ONLY): 24 min  Charges:  $Neuromuscular Re-education: 8-22 mins $Self Care/Home Management: 8-22                     Magda Kiel, PT Acute Rehabilitation Services [phone] Office:[phone] 04/13/2020    Reginia Naas 04/13/2020, 3:11 PM

## 2020-04-13 NOTE — Evaluation (Signed)
Physical Therapy Evaluation Patient Details Name: Shannon Morales MRN: [id number] DOB: 13-Feb-1925 Today's Date: 04/13/2020   History of Present Illness  Pt is a 86 year old female who exsperienced a near syncope episode. She was trying to utilize the bathroom at night when she became dizzy upon standing from her bed and walked to the bathroom and then fell into the bathtub. She denied hitting her head or losing consciousness. She reported a headache and new blurred vision also. CT of head was negative. MRI revealed R parietal subacute infarct. NIH 0. Medical hx of hypothyroidism, HTN, and CVA.  Clinical Impression  Pt demonstrates unsteadiness with standing and gait, impacting her safety with mobility. Her MMT scores in her B LEs are grossly WNL of 4 to 4+ throughout. However, the MD reported to PT personally that he suspects vestibular deficits. She tends to drift and lose balance laterally when not utilizing an assistive device. In addition, she displays impulsive tendencies as she would attempt to get up and run to bathroom due to moment of urinary incontinence without being aware of IV line and required cues to stop to maintain her safety. Her DGI score of 11 suggests she is at risk for falls, and thus she would benefit from continued PT services to address her deficits to maximize her independence and safety with all functional mobility.     Follow Up Recommendations Home health PT;Supervision for mobility/OOB    Equipment Recommendations  Rolling walker with 5" wheels;3in1 (PT)    Recommendations for Other Services       Precautions / Restrictions Precautions Precautions: Fall Precaution Comments: HOH Restrictions Weight Bearing Restrictions: No      Mobility  Bed Mobility Overal bed mobility: Independent             General bed mobility comments: Pt able to transition supine > sit EOB with bed flat with increased time, but no use of bed rails.  Transfers Overall transfer  level: Needs assistance Equipment used: None Transfers: Sit to/from Stand Sit to Stand: Min guard         General transfer comment: Pt unsteady with STS and impulsive with quickly coming to stand, requiring multiple cues to maintain safety and wait for therapist to be nearby before transferring.  Ambulation/Gait Ambulation/Gait assistance: Min guard;Min assist Gait Distance (Feet): 200 Feet Assistive device: None Gait Pattern/deviations: Step-through pattern;Decreased stride length;Decreased dorsiflexion - right;Decreased dorsiflexion - left;Shuffle;Drifts right/left;Narrow base of support Gait velocity: decreased Gait velocity interpretation: <1.8 ft/sec, indicate of risk for recurrent falls General Gait Details: Ambulates with trunk sway and drifting L and R, requiring min guard majority of time but minA to recover when pt would lose balance. Initial contact with mid-foot and decreased B step length.  Stairs Stairs: Yes Stairs assistance: Min assist;Min guard Stair Management: One rail Right;Alternating pattern Number of Stairs: 2 General stair comments: Reciprocal pattern ascending and descending with utilization of R hand rail, displaying unsteadiness particularly with descending. Required min guard majority of time, but minA 1x when minor LOB descending stairs.  Wheelchair Mobility    Modified Rankin (Stroke Patients Only) Modified Rankin (Stroke Patients Only) Pre-Morbid Rankin Score: No symptoms Modified Rankin: Moderate disability     Balance Overall balance assessment: Needs assistance Sitting-balance support: No upper extremity supported;Feet supported Sitting balance-Leahy Scale: Normal Sitting balance - Comments: Pt able to sit EOB statically and dynamically without LOB.   Standing balance support: No upper extremity supported Standing balance-Leahy Scale: Good Standing balance comment: Pt able to  stand statically and perform functional mobility without UE  support, but displays trunk sway and drifting requiring min guard and intermittent minA when LOB.                 Standardized Balance Assessment Standardized Balance Assessment : Dynamic Gait Index   Dynamic Gait Index Level Surface: Moderate Impairment Change in Gait Speed: Mild Impairment Gait with Horizontal Head Turns: Moderate Impairment Gait with Vertical Head Turns: Moderate Impairment Gait and Pivot Turn: Moderate Impairment Step Over Obstacle: Mild Impairment Step Around Obstacles: Moderate Impairment Steps: Mild Impairment Total Score: 11       Pertinent Vitals/Pain Pain Assessment: No/denies pain    Home Living Family/patient expects to be discharged to:: Private residence Living Arrangements: Alone Available Help at Discharge: Family;Neighbor;Available 24 hours/day (daughter available) Type of Home: House Home Access: Level entry     Home Layout: Two level;Laundry or work area in basement;Able to live on main level with bedroom/bathroom Home Equipment: Grab bars - tub/shower;Shower seat;Hand held shower head      Prior Function Level of Independence: Independent         Comments: Pt reports being IND with all functional mobility and ADLs. She reports still driving. She denies falls in the past 6 months until this one that caused her to come to the hospital.     Hand Dominance   Dominant Hand: Right    Extremity/Trunk Assessment   Upper Extremity Assessment Upper Extremity Assessment: Defer to OT evaluation    Lower Extremity Assessment Lower Extremity Assessment: Overall WFL for tasks assessed       Communication   Communication: HOH  Cognition Arousal/Alertness: Awake/alert Behavior During Therapy: Impulsive Overall Cognitive Status: No family/caregiver present to determine baseline cognitive functioning                                 General Comments: A&O x4      General Comments General comments (skin integrity,  edema, etc.): MD suspecting possible vestibular issues per personal report to PT this date.    Exercises     Assessment/Plan    PT Assessment Patient needs continued PT services  PT Problem List Decreased balance;Decreased mobility;Decreased knowledge of use of DME;Decreased safety awareness       PT Treatment Interventions DME instruction;Gait training;Stair training;Functional mobility training;Therapeutic activities;Therapeutic exercise;Balance training;Neuromuscular re-education;Patient/family education    PT Goals (Current goals can be found in the Care Plan section)  Acute Rehab PT Goals Patient Stated Goal: to get back to normal and go home PT Goal Formulation: With patient Time For Goal Achievement: 04/13/20 Potential to Achieve Goals: Good    Frequency Min 3X/week   Barriers to discharge        Co-evaluation               AM-PAC PT "6 Clicks" Mobility  Outcome Measure Help needed turning from your back to your side while in a flat bed without using bedrails?: None Help needed moving from lying on your back to sitting on the side of a flat bed without using bedrails?: None Help needed moving to and from a bed to a chair (including a wheelchair)?: A Little Help needed standing up from a chair using your arms (e.g., wheelchair or bedside chair)?: A Little Help needed to walk in hospital room?: A Little Help needed climbing 3-5 steps with a railing? : A Little 6 Click Score: 20  End of Session Equipment Utilized During Treatment: Gait belt Activity Tolerance: Patient tolerated treatment well Patient left: in bed;with call bell/phone within reach;Other (comment) (with MD present) Nurse Communication: Other (comment) (Communicated with MD on urinary incontinence during session) PT Visit Diagnosis: Unsteadiness on feet (R26.81);Other abnormalities of gait and mobility (R26.89);Muscle weakness (generalized) (M62.81);History of falling (Z91.81);Difficulty in  walking, not elsewhere classified (R26.2);Other symptoms and signs involving the nervous system (R29.898)    Time: 1610-9604 PT Time Calculation (min) (ACUTE ONLY): 41 min   Charges:   PT Evaluation $PT Eval Low Complexity: 1 Low PT Treatments $Gait Training: 23-37 mins        Moishe Spice, PT, DPT Acute Rehabilitation Services  Pager: [phone] Office: [phone]   Orvan Falconer 04/13/2020, 10:23 AM

## 2020-04-13 NOTE — Progress Notes (Signed)
Occupational Therapy Evaluation Patient Details Name: Shannon Morales MRN: [id number] DOB: 1924/07/07 Today's Date: 04/13/2020    History of Present Illness Pt is a 86 year old female who exsperienced a near syncope episode. She was trying to utilize the bathroom at night when she became dizzy upon standing from her bed and walked to the bathroom and then fell into the bathtub. She denied hitting her head or losing consciousness. She reported a headache and new blurred vision also. CT of head was negative. MRI revealed R parietal subacute infarct. NIH 0. Medical hx of hypothyroidism, HTN, and CVA.   Clinical Impression   PTA, pt lived at home alone and was independent with ADL and mobility, including driving and IADL tasks. Pt demonstrates deficits listed below and feel she would greatly benefit from follow up with [facility]. Pt demonstrated reduced attention to L at times during session, especially when distracted. Pt states "I feel a little wobbly". Son issued gait belt to use with his Mom to reduce risk of falls. Recommend she follow up with her eye doctor for a full field assessment as she states she is due for an eye exam. Also recommend pt have direct supervision with medication and financial management. Son states they will be able to assit as needed. Recommend fall alert system as well.    All further OT to be addressed by [facility].   No complaints of dizziness during session.   Follow Up Recommendations  Home health OT;Supervision/Assistance - 24 hour (initially; refrain from driving until cleared by neurologist and eye doctor; follow up with eye doctor)    Equipment Recommendations  None recommended by OT    Recommendations for Other Services       Precautions / Restrictions Precautions Precautions: Fall Precaution Comments: HOH Restrictions Weight Bearing Restrictions: No      Mobility Bed Mobility Overal bed mobility: Independent             General bed mobility comments: Pt  able to transition supine > sit EOB with bed flat with increased time, but no use of bed rails.  Transfers Overall transfer level: Needs assistance Equipment used: 1 person hand held assist Transfers: Sit to/from [facility] Sit to Stand: Min guard Stand pivot transfers: Min guard       General transfer comment: Pt unsteady with STS and impulsive with quickly coming to stand, requiring multiple cues to maintain safety and wait for therapist to be nearby before transferring.    Balance Overall balance assessment: Needs assistance Sitting-balance support: No upper extremity supported;Feet supported Sitting balance-Leahy Scale: Normal Sitting balance - Comments: Pt able to sit EOB statically and dynamically without LOB.   Standing balance support: No upper extremity supported Standing balance-Leahy Scale: Fair Standing balance comment: Pt able to stand statically and perform functional mobility without UE support, but displays trunk sway and drifting requiring min guard and intermittent minA when LOB.                 Standardized Balance Assessment Standardized Balance Assessment : Dynamic Gait Index   Dynamic Gait Index Level Surface: Moderate Impairment Change in Gait Speed: Mild Impairment Gait with Horizontal Head Turns: Moderate Impairment Gait with Vertical Head Turns: Moderate Impairment Gait and Pivot Turn: Moderate Impairment Step Over Obstacle: Mild Impairment Step Around Obstacles: Moderate Impairment Steps: Mild Impairment Total Score: 11     ADL either performed or assessed with clinical judgement   ADL Overall ADL's : Needs assistance/impaired  Functional mobility during ADLs: Min guard General ADL Comments: overall set up for UB ADL and minguard for LB ADL; unaware of incontinence; recommend use of shower seat when bathing and for her to have full supervision     Vision Baseline  Vision/History: Wears glasses Wears Glasses: At all times Patient Visual Report: No change from baseline Vision Assessment?: Yes Eye Alignment: Within Functional Limits Ocular Range of Motion: Within Functional Limits Alignment/Gaze Preference: Within Defined Limits Tracking/Visual Pursuits: Able to track stimulus in all quads without difficulty Saccades: Within functional limits Convergence: Within functional limits Visual Fields: Other (comment) (decreased response time)     Perception Perception Perception Tested?: Yes Spatial deficits: difficulty maintaining midline when walking in hall; [place] counter on L side. Will further assess   Praxis Praxis Praxis tested?: Within functional limits    Pertinent Vitals/Pain Pain Assessment: No/denies pain     Hand Dominance Right   Extremity/Trunk Assessment Upper Extremity Assessment Upper Extremity Assessment: Overall WFL for tasks assessed   Lower Extremity Assessment Lower Extremity Assessment: Defer to PT evaluation   Cervical / Trunk Assessment Cervical / Trunk Assessment: Normal   Communication Communication Communication: HOH   Cognition Arousal/Alertness: Awake/alert Behavior During Therapy: Impulsive Overall Cognitive Status: Impaired/Different from baseline Area of Impairment: Attention;Safety/judgement;Awareness;Problem solving                   Current Attention Level: Selective     Safety/Judgement: Decreased awareness of deficits Awareness: Emergent Problem Solving: Slow processing General Comments: A&O x4; will further assess; pt may have baseline cognitive deficits   General Comments  No complaints of dizziness during session or headmovements    Exercises     Shoulder Instructions      Home Living Family/patient expects to be discharged to:: Private residence Living Arrangements: Alone Available Help at Discharge: Family;Neighbor;Available 24 hours/day Type of Home: House Home Access:  Level entry     Home Layout: Two level;Laundry or work area in basement;Able to live on main level with bedroom/bathroom Alternate Therapist, sports of Steps: flight Alternate Level Stairs-Rails: Right;Left Bathroom Shower/Tub: Corporate investment banker: Handicapped height Bathroom Accessibility: Yes How Accessible: Accessible via walker (sideways) Home Equipment: Grab bars - tub/shower;Shower seat;Hand held shower head      Lives With: Alone    Prior Functioning/Environment Level of Independence: Independent        Comments: drives; does her own finances        OT Problem List: Impaired balance (sitting and/or standing);Decreased cognition;Decreased safety awareness      OT Treatment/Interventions:      OT Goals(Current goals can be found in the care plan section) Acute Rehab OT Goals Patient Stated Goal: to get back to normal and go home OT Goal Formulation: All assessment and education complete, DC therapy (continue OT with [facility]OT)  OT Frequency:     Barriers to D/C:            Co-evaluation              AM-PAC OT "6 Clicks" Daily Activity     Outcome Measure Help from another person eating meals?: None Help from another person taking care of personal grooming?: A Little Help from another person toileting, which includes using toliet, bedpan, or urinal?: A Little Help from another person bathing (including washing, rinsing, drying)?: A Little Help from another person to put on and taking off regular upper body clothing?: A Little Help from another person to put on and  taking off regular lower body clothing?: A Little 6 Click Score: 19   End of Session Equipment Utilized During Treatment: Gait belt Nurse Communication: Mobility status;Other (comment) (DC needs)  Activity Tolerance: Patient tolerated treatment well Patient left: in bed;with call bell/phone within reach;with family/visitor present (sittin gEOB)  OT Visit Diagnosis:  Unsteadiness on feet (R26.81);History of falling (Z91.81);Other symptoms and signs involving cognitive function;Dizziness and giddiness (R42)                Time: 1110-1155 OT Time Calculation (min): 45 min Charges:  OT General Charges $OT Visit: 1 Visit OT Evaluation $OT Eval Moderate Complexity: 1 Mod OT Treatments $Self Care/Home Management : 8-22 mins  Maurie Boettcher, OT/L   Acute OT Clinical Specialist Acute Rehabilitation Services Pager [phone] Office [phone]   [facility] 04/13/2020, 12:11 PM

## 2020-04-13 NOTE — Progress Notes (Signed)
  Echocardiogram 2D Echocardiogram has been performed.  [name] 04/13/2020, 8:58 AM

## 2020-04-13 NOTE — Progress Notes (Signed)
STROKE TEAM PROGRESS NOTE   INTERVAL HISTORY I personally reviewed history of presenting illness with the patient, electronic medical and gait ataxia but denies true vertigo.  MRI scan shows a subacute right parietal infarct which is likely clinically silent.  Her dizziness symptoms may be related to peripheral vestibular dysfunction. Vitals:   04/12/20 2343 04/13/20 0200 04/13/20 0357 04/13/20 0757  BP: (!) 130/52 (!) 149/58 (!) 149/59 (!) 151/64  Pulse: 70 64 66 75  Resp: 17 18 16 18   Temp: 97.7 F (36.5 C) 97.9 F (36.6 C) 97.6 F (36.4 C) (!) 97.5 F (36.4 C)  TempSrc: Oral Oral Oral Oral  SpO2: 96% 97% 96% 97%  Weight:      Height:       CBC:  Recent Labs  Lab 04/12/20 0911 04/12/20 0927  WBC 13.3*  --   NEUTROABS 11.1*  --   HGB 13.5 12.9  HCT 41.3 38.0  MCV 93.2  --   PLT 260  --    Basic Metabolic Panel:  Recent Labs  Lab 04/12/20 0911 04/12/20 0927  NA 140 142  K 3.6 3.4*  CL 108 109  CO2 20*  --   GLUCOSE 170* 170*  BUN 15 17  CREATININE 0.88 0.80  CALCIUM 9.1  --    Lipid Panel:  Recent Labs  Lab 04/13/20 0104  CHOL 159  TRIG 87  HDL 58  CHOLHDL 2.7  VLDL 17  LDLCALC 84   HgbA1c:  Recent Labs  Lab 04/13/20 0104  HGBA1C 5.6   Urine Drug Screen: No results for input(s): LABOPIA, COCAINSCRNUR, LABBENZ, AMPHETMU, THCU, LABBARB in the last 168 hours.  Alcohol Level No results for input(s): ETH in the last 168 hours.  IMAGING past 24 hours CT ANGIO HEAD W OR WO CONTRAST  Result Date: 04/12/2020 CLINICAL DATA:  86 year old female with suspected small cortical subacute infarct in the posterior right MCA territory on MRI earlier today. Near syncope, head trauma. EXAM: CT ANGIOGRAPHY HEAD AND NECK TECHNIQUE: Multidetector CT imaging of the head and neck was performed using the standard protocol during bolus administration of intravenous contrast. Multiplanar CT image reconstructions and MIPs were obtained to evaluate the vascular anatomy. Carotid  stenosis measurements (when applicable) are obtained utilizing NASCET criteria, using the distal internal carotid diameter as the denominator. CONTRAST:  49mL OMNIPAQUE IOHEXOL 350 MG/ML SOLN COMPARISON:  Brain MRI and head CT earlier today. FINDINGS: CTA NECK Skeleton: Age indeterminate but probably chronic mild T3 superior endplate compression. No acute osseous abnormality identified. Upper chest: Mild scarring in the superior segment of the right lower lobe, otherwise negative. Other neck: Negative. Aortic arch: Calcified aortic atherosclerosis. 3 vessel arch configuration. Right carotid system: Mildly tortuous brachiocephalic artery without stenosis. Normal right CCA origin. Tortuous proximal right CCA with a partially retropharyngeal course. Bulky calcified plaque at the right carotid bifurcation although more affecting the ECA origin. Proximal right ICA stenosis is less than 50 % with respect to the distal vessel (series 9, image 68). Left carotid system: Normal left CCA origin. Calcified plaque at the left carotid bifurcation, although no proximal left ICA stenosis. Tortuous left ICA distal to the bulb. Vertebral arteries: Mild plaque in the proximal right subclavian artery without stenosis. Plaque at the right vertebral artery origin results in mild stenosis on series 8, image 147. The right vertebral artery is non dominant and otherwise normal to the skull base. Moderate soft and calcified plaque in the proximal left subclavian artery without significant stenosis. Calcified  plaque at the posterior left vertebral artery origin results in only mild stenosis (series 9, image 116). Tortuous left V1 segment. Dominant left vertebral artery is tortuous but patent to the skull base without stenosis. CTA HEAD Posterior circulation: The non dominant right vertebral artery functionally terminates in PICA. Dominant left vertebral supplies the basilar with mild plaque and no stenosis. Normal left PICA origin. Patent  basilar artery with tortuosity but no stenosis. Normal SCA and PCA origins. Posterior communicating arteries are diminutive or absent. Mild left P2 segment irregularity and stenosis. Left PCA branches are otherwise within normal limits. But there is severe right P2 segment stenosis (series 12, image 17 and series 10, image 18). The right PCA remains patent, other PCA branches are within normal limits. Anterior circulation: Both ICA siphons are patent. Moderate calcified plaque on the left only results in mild stenosis. Normal left ophthalmic artery origin. Moderate calcified plaque on the right results in mild stenosis which is most pronounced in the supraclinoid segment. Normal right ophthalmic artery origin. Patent carotid termini. Normal MCA and ACA origins. Normal anterior communicating artery and bilateral ACA branches. Left MCA M1 segment and bifurcation are patent without stenosis. Left MCA branches are within normal limits. Right MCA M1 segment and bifurcation are patent without stenosis. The right MCA posterior division is mildly ectatic without discrete aneurysm (series 10, image 18 and series 11, image 20). Right MCA branches are otherwise within normal limits. Venous sinuses: Patent. Anatomic variants: Dominant left vertebral artery, the right is diminutive and functionally terminates in PICA. Review of the MIP images confirms the above findings IMPRESSION: 1. Negative for large vessel occlusion. 2. Positive for Severe stenosis of the Right PCA P2 segment. 3. Calcified carotid atherosclerosis with only mild stenosis. Dominant left vertebral artery with only mild origin stenosis. 4. Aortic Atherosclerosis (ICD10-I70.0). Electronically Signed   By: Genevie Ann M.D.   On: 04/12/2020 19:42   CT Head Wo Contrast  Result Date: 04/12/2020 CLINICAL DATA:  Head trauma.  Near syncope EXAM: CT HEAD WITHOUT CONTRAST TECHNIQUE: Contiguous axial images were obtained from the base of the skull through the vertex  without intravenous contrast. COMPARISON:  None. FINDINGS: Brain: No evidence of acute infarction, hemorrhage, hydrocephalus, extra-axial collection or mass lesion/mass effect. Scattered low-density changes within the periventricular and subcortical white matter compatible with chronic microvascular ischemic change. Mild diffuse cerebral volume loss. Vascular: Atherosclerotic calcifications involving the large vessels of the skull base. No unexpected hyperdense vessel. Skull: Normal. Negative for fracture or focal lesion. Sinuses/Orbits: No acute finding. Other: None. IMPRESSION: 1. No acute intracranial findings. 2. Chronic microvascular ischemic change and cerebral volume loss. Electronically Signed   By: Davina Poke D.O.   On: 04/12/2020 11:09   CT ANGIO NECK W OR WO CONTRAST  Result Date: 04/12/2020 CLINICAL DATA:  86 year old female with suspected small cortical subacute infarct in the posterior right MCA territory on MRI earlier today. Near syncope, head trauma. EXAM: CT ANGIOGRAPHY HEAD AND NECK TECHNIQUE: Multidetector CT imaging of the head and neck was performed using the standard protocol during bolus administration of intravenous contrast. Multiplanar CT image reconstructions and MIPs were obtained to evaluate the vascular anatomy. Carotid stenosis measurements (when applicable) are obtained utilizing NASCET criteria, using the distal internal carotid diameter as the denominator. CONTRAST:  68mL OMNIPAQUE IOHEXOL 350 MG/ML SOLN COMPARISON:  Brain MRI and head CT earlier today. FINDINGS: CTA NECK Skeleton: Age indeterminate but probably chronic mild T3 superior endplate compression. No acute osseous abnormality identified. Upper  chest: Mild scarring in the superior segment of the right lower lobe, otherwise negative. Other neck: Negative. Aortic arch: Calcified aortic atherosclerosis. 3 vessel arch configuration. Right carotid system: Mildly tortuous brachiocephalic artery without stenosis.  Normal right CCA origin. Tortuous proximal right CCA with a partially retropharyngeal course. Bulky calcified plaque at the right carotid bifurcation although more affecting the ECA origin. Proximal right ICA stenosis is less than 50 % with respect to the distal vessel (series 9, image 68). Left carotid system: Normal left CCA origin. Calcified plaque at the left carotid bifurcation, although no proximal left ICA stenosis. Tortuous left ICA distal to the bulb. Vertebral arteries: Mild plaque in the proximal right subclavian artery without stenosis. Plaque at the right vertebral artery origin results in mild stenosis on series 8, image 147. The right vertebral artery is non dominant and otherwise normal to the skull base. Moderate soft and calcified plaque in the proximal left subclavian artery without significant stenosis. Calcified plaque at the posterior left vertebral artery origin results in only mild stenosis (series 9, image 116). Tortuous left V1 segment. Dominant left vertebral artery is tortuous but patent to the skull base without stenosis. CTA HEAD Posterior circulation: The non dominant right vertebral artery functionally terminates in PICA. Dominant left vertebral supplies the basilar with mild plaque and no stenosis. Normal left PICA origin. Patent basilar artery with tortuosity but no stenosis. Normal SCA and PCA origins. Posterior communicating arteries are diminutive or absent. Mild left P2 segment irregularity and stenosis. Left PCA branches are otherwise within normal limits. But there is severe right P2 segment stenosis (series 12, image 17 and series 10, image 18). The right PCA remains patent, other PCA branches are within normal limits. Anterior circulation: Both ICA siphons are patent. Moderate calcified plaque on the left only results in mild stenosis. Normal left ophthalmic artery origin. Moderate calcified plaque on the right results in mild stenosis which is most pronounced in the  supraclinoid segment. Normal right ophthalmic artery origin. Patent carotid termini. Normal MCA and ACA origins. Normal anterior communicating artery and bilateral ACA branches. Left MCA M1 segment and bifurcation are patent without stenosis. Left MCA branches are within normal limits. Right MCA M1 segment and bifurcation are patent without stenosis. The right MCA posterior division is mildly ectatic without discrete aneurysm (series 10, image 18 and series 11, image 20). Right MCA branches are otherwise within normal limits. Venous sinuses: Patent. Anatomic variants: Dominant left vertebral artery, the right is diminutive and functionally terminates in PICA. Review of the MIP images confirms the above findings IMPRESSION: 1. Negative for large vessel occlusion. 2. Positive for Severe stenosis of the Right PCA P2 segment. 3. Calcified carotid atherosclerosis with only mild stenosis. Dominant left vertebral artery with only mild origin stenosis. 4. Aortic Atherosclerosis (ICD10-I70.0). Electronically Signed   By: Genevie Ann M.D.   On: 04/12/2020 19:42   MR BRAIN WO CONTRAST  Result Date: 04/12/2020 CLINICAL DATA:  Head trauma, near syncope. EXAM: MRI HEAD WITHOUT CONTRAST TECHNIQUE: Multiplanar, multiecho pulse sequences of the brain and surrounding structures were obtained without intravenous contrast. COMPARISON:  04/12/2020 head CT and prior. FINDINGS: Brain: Mild diffusion-weighted hyperintensity without focal ADC correlate involving the right parietal lobe (2:34). No intracranial hemorrhage. No midline shift, ventriculomegaly or extra-axial fluid collection. No mass lesion. Mild diffuse parenchymal volume loss with ex vacuo dilatation. Mild chronic microvascular ischemic changes. Vascular: The right V4 segment is not well visualized. It may terminate as PICA or be diminutive. Otherwise grossly  preserved major intracranial vessels. Skull and upper cervical spine: Normal marrow signal. Sinuses/Orbits: Sequela of  bilateral lens replacement. Normal orbits. Mild ethmoid sinus mucosal thickening. Trace left mastoid free fluid. Other: None. IMPRESSION: Mild right parietal DWI hyperintensity concerning for subacute infarct. Mild cerebral atrophy and chronic microvascular ischemic changes. These results were called by telephone at the time of interpretation on 04/12/2020 at 2:38 pm to provider DAN FLOYD , who verbally acknowledged these results. Electronically Signed   By: Primitivo Gauze M.D.   On: 04/12/2020 14:39   CT ABDOMEN PELVIS W CONTRAST  Result Date: 04/12/2020 CLINICAL DATA:  86 year old female with history of right lower quadrant abdominal pain. EXAM: CT ABDOMEN AND PELVIS WITH CONTRAST TECHNIQUE: Multidetector CT imaging of the abdomen and pelvis was performed using the standard protocol following bolus administration of intravenous contrast. CONTRAST:  146mL OMNIPAQUE IOHEXOL 350 MG/ML SOLN COMPARISON:  CT the abdomen and pelvis 01/21/2020. FINDINGS: Lower chest: Atherosclerotic calcifications in the descending thoracic aorta as well as the left main, left anterior descending, left circumflex and right coronary arteries. Thickening calcification of the aortic valve. Hepatobiliary: No suspicious cystic or solid hepatic lesions. No intra or extrahepatic biliary ductal dilatation. Status post cholecystectomy. Pancreas: No pancreatic mass. No pancreatic ductal dilatation. No pancreatic or peripancreatic fluid collections or inflammatory changes. Spleen: Unremarkable. Adrenals/Urinary Tract: Mild multifocal cortical thinning in the kidneys bilaterally. No suspicious renal lesions. Bilateral adrenal glands are normal in appearance. No hydroureteronephrosis. Urinary bladder is moderately distended, but otherwise unremarkable in appearance. Stomach/Bowel: Normal appearance of the stomach. No pathologic dilatation of small bowel or colon. Large periampullary duodenal diverticulum again noted, without surrounding  inflammatory changes to suggest an associated diverticulitis. Status post right hemicolectomy. Vascular/Lymphatic: Aortic atherosclerosis, without evidence of aneurysm or dissection in the abdominal or pelvic vasculature. No lymphadenopathy noted in the abdomen or pelvis. Reproductive: Status post hysterectomy. Ovaries are not confidently identified may be surgically absent or atrophic. Other: No significant volume of ascites.  No pneumoperitoneum. Musculoskeletal: Chronic appearing compression fracture of L1 with 40% loss of anterior vertebral body height, similar to the prior study. There are no aggressive appearing lytic or blastic lesions noted in the visualized portions of the skeleton. IMPRESSION: 1. No acute findings are noted in the abdomen or pelvis to account for the patient's symptoms. 2. Status post right hemicolectomy and cholecystectomy. 3. Aortic atherosclerosis, in addition to left main and 3 vessel coronary artery disease. 4. There are calcifications of the aortic valve. Echocardiographic correlation for evaluation of potential valvular dysfunction may be warranted if clinically indicated. 5. Additional incidental findings, as above. Electronically Signed   By: Vinnie Langton M.D.   On: 04/12/2020 11:18    PHYSICAL EXAM Pleasant elderly Caucasian lady not in distress.  She is hard of hearing. . Afebrile. Head is nontraumatic. Neck is supple without bruit.    Cardiac exam no murmur or gallop. Lungs are clear to auscultation. Distal pulses are well felt. Neurological Exam ;  Awake  Alert oriented x 3. Normal speech and language.eye movements full without nystagmus.fundi were not visualized. Vision acuity and fields appear normal. Hearing is diminished bilaterally.  L. Palatal movements are normal. Face symmetric. Tongue midline. Normal strength, tone, reflexes and coordination. Normal sensation.  Headshaking does not produce any nystagmus or dizziness.  Fukuda stepping test is positive with  the patient rotating to the right and moving off base.  Hallpike maneuver not done.  Gait deferred.  ASSESSMENT/PLAN Ms. Shannon Morales is a 86 y.o. female  with history of HTN and hypothyroidism  presenting with reproducible vertigo when looking to the R w/ nystagmus.   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  Clinical presentation more consistent with peripheral vestibular dysfunction.  CT head No acute abnormality. Small vessel disease. Atrophy.   MRI  Small subacute R parietal infarct. Small vessel disease. Atrophy.   CTA head & neck no LVO. Severe stenosis R P2. ICA atherosclerosis w/ mild stenosis. L VA origin w/ mild stenosis. Aortic atherosclerosis.   Echo pending   LDL 84  HgbA1c 5.6  VTE prophylaxis - Lovenox 40 mg sq daily   No antithrombotic prior to admission, now on aspirin 325 mg daily. Decrease aspirin to 81 and add plavix. Continue DAPT x 3 weeks then aspirin alone.   Therapy recommendations:  pending   Disposition:  pending   OP 30 d monitor recommended to look for AF as source of stroke  BPPV  Etiology of presenting sx  PT to assess vestibular needs  Rotates to right with stepping  Hypertension  Stable . Permissive hypertension (OK if < 220/120) but gradually normalize in 5-7 days . Long-term BP goal normotensive  Hyperlipidemia  Home meds:  No statin  Now on lipitor 40   LDL 84, goal < 70  Continue statin at discharge  Other Stroke Risk Factors  Advanced age  ETOH use, advised to drink no more than 1 drink(s) a day  Overweight, Body mass index is 28.89 kg/m., recommend weight loss, diet and exercise as appropriate   Other Active Problems  Hypothyroidism, on synthroid    Hospital day # 0  Continue ongoing stroke work-up.  Check 30-day external cardiac monitoring for paroxysmal A. fib after discharge.  Physical therapy for vestibular exercises.  Aspirin and Plavix for 3  weeks followed by aspirin alone.  Long discussion with patient, Dr. Jaynie Collins physical therapist.  Greater than 50% time during the 35-minute visit was spent on counseling and coordination of care about her silent infarct, peripheral vestibular dysfunction and dizziness and answering questions. Antony Contras, MD To contact Stroke Continuity provider, please refer to [email or web address] After hours, contact General Neurology

## 2020-04-13 NOTE — Discharge Instructions (Signed)
 Hospital Discharge After a Stroke  Being discharged from the hospital after a stroke can feel overwhelming. Many things may be different, and it is normal to feel scared or anxious. Some stroke survivors may be able to return to their homes, and others may need more specialized care on a temporary or permanent basis. Your stroke care team will work with you to develop a discharge plan that is best for you. Ask questions if you do not understand something. Invite a friend or family member to participate in discharge planning. Understanding and following your discharge plan can help to prevent another stroke or other problems. Understanding your medicines After a stroke, your health care provider may prescribe one or more types of medicine. It is important to take medicines exactly as told by your health care provider. Serious harm, such as another stroke, can happen if you are unable to take your medicine exactly as prescribed. Make sure you understand:  What medicine to take.  Why you are taking the medicine.  How and when to take it.  If it can be taken with your other medicines and herbal supplements.  Possible side effects.  When to call your health care provider if you have any side effects.  How you will get and pay for your medicines. Medical assistance programs may be able to help you pay for prescription medicines if you cannot afford them. If you are taking an anticoagulant, be sure to take it exactly as told by your health care provider. This type of medicine can increase the risk of bleeding because it works to prevent blood from clotting. You may need to take certain precautions to prevent bleeding. You should contact your health care provider if you have:  Bleeding or bruising.  A fall or other injury to your head.  Blood in your urine or stool (feces). Planning for home safety  Take steps to prevent falls, such as installing grab bars or using a shower chair. Ask a  friend or family member to get needed things in place before you go home if possible. A therapist can come to your home to make recommendations for safety equipment. Ask your health care provider if you would benefit from this service or from home care. Getting needed equipment Ask your health care provider for a list of any medical equipment and supplies you will need at home. These may include items such as:  Walkers.  Canes.  Wheelchairs.  Hand-strengthening devices.  Special eating utensils. Medical equipment can be rented or purchased, depending on your insurance coverage. Check with your insurance company about what is covered. Keeping follow-up visits After a stroke, you will need to follow up regularly with a health care provider. You may also need rehabilitation, which can include physical therapy, occupational therapy, or speech-language therapy. Keeping these appointments is very important to your recovery after a stroke. Be sure to bring your medicine list and discharge papers with you to your appointments. If you need help to keep track of your schedule, use a calendar or appointment reminder. Preventing another stroke Having a stroke puts you at risk for another stroke in the future. Ask your health care provider what actions you can take to lower the risk. These may include:  Increasing how much you exercise.  Making a healthy eating plan.  Quitting smoking.  Managing other health conditions, such as high blood pressure, high cholesterol, or diabetes.  Limiting alcohol use. Knowing the warning signs of a stroke  Make   sure you understand the signs of a stroke. Before you leave the hospital, you will receive information outlining the stroke warning signs. Share these with your friends and family members. "BE FAST" is an easy way to remember the main warning signs of a stroke:  B - Balance. Signs are dizziness, sudden trouble walking, or loss of balance.  E - Eyes.  Signs are trouble seeing or a sudden change in vision.  F - Face. Signs are sudden weakness or numbness of the face, or the face or eyelid drooping on one side.  A - Arms. Signs are weakness or numbness in an arm. This happens suddenly and usually on one side of the body.  S - Speech. Signs are sudden trouble speaking, slurred speech, or trouble understanding what people say.  T - Time. Time to call emergency services. Write down what time symptoms started. Other signs of stroke may include:  A sudden, severe headache with no known cause.  Nausea or vomiting.  Seizure. These symptoms may represent a serious problem that is an emergency. Do not wait to see if the symptoms will go away. Get medical help right away. Call your local emergency services (911 in the [place] Do not drive yourself to the hospital. Make note of the time that you had your first symptoms. Your emergency responders or emergency room staff will need to know this information. Summary  Being discharged from the hospital after a stroke can feel overwhelming. It is normal to feel scared or anxious.  Make sure you take medicines exactly as told by your health care provider.  Know the warning signs of a stroke, and get help right way if you have any of these symptoms. "BE FAST" is an easy way to remember the main warning signs of a stroke. This information is not intended to replace advice given to you by your health care provider. Make sure you discuss any questions you have with your health care provider. Document Revised: 03/11/2019 Document Reviewed: 09/21/2016 Elsevier Patient Education  2020 [employer]  

## 2020-04-13 NOTE — Plan of Care (Signed)
  Problem: Education: Goal: Knowledge of patient specific risk factors addressed and post discharge goals established will improve Outcome: Progressing   Problem: Education: Goal: Knowledge of secondary prevention will improve Outcome: Progressing   Problem: Education: Goal: Knowledge of disease or condition will improve Outcome: Progressing

## 2020-04-13 NOTE — Evaluation (Signed)
Speech Language Pathology Evaluation Patient Details Name: Shannon Morales MRN: [id number] DOB: December 11, 1924 Today's Date: 04/13/2020 Time: 4315-4008 SLP Time Calculation (min) (ACUTE ONLY): 19 min  Problem List:  Patient Active Problem List   Diagnosis Date Noted  . Memory changes 04/12/2020  . Functional diarrhea 04/12/2020  . Acute CVA (cerebrovascular accident) ([place]) 04/12/2020  . Hypothyroidism (acquired)   . Hypertension   . CVA (cerebral vascular accident) ([place])   . Aortic atherosclerosis ([place]) 01/27/2020  . Hearing loss 01/27/2020  . Essential hypertension 06/30/2017  . Primary osteoarthritis of left knee 06/20/2017   Past Medical History:  Past Medical History:  Diagnosis Date  . CVA (cerebral vascular accident) ([place])   . Hypertension   . Hypothyroidism (acquired)    Past Surgical History:  Past Surgical History:  Procedure Laterality Date  . HYSTERECTOMY ABDOMINAL WITH SALPINGO-OOPHORECTOMY     due to [facility]   HPI:  Pt is a 86 year old female admitted after near syncope episode. MRI revealed R parietal subacute infarct. Medical hx of hypothyroidism, HTN, and CVA.   Assessment / Plan / Recommendation Clinical Impression  Pt demonstrates mild cognitive impairments with son at bedside and admits she has forgotten if she had taken her meds on occasion. She lives alone and manages her finances and medication. She is completely oriented but has decreased anticipatory awareness of physical deficits and executive functions such as self regulation (not aware of balance issues when asked and reminded of PT session this morning). She scored in average range for her age on Cognistat word recall recalling 3/4 independently however mild challenges present in contextual situations. Speech and language are normal. Recommendation for home ST, purchase pill box and have family member supervise bill paying ,checkbook balancing and cooking. Pt and son in agreement.            SLP Assessment   SLP Recommendation/Assessment: All further Speech Lanaguage Pathology  needs can be addressed in the next venue of care SLP Visit Diagnosis: Cognitive communication deficit (R41.841)    Follow Up Recommendations  Home health SLP    Frequency and Duration           SLP Evaluation Cognition  Overall Cognitive Status: Impaired/Different from baseline Arousal/Alertness: Awake/alert Orientation Level: Oriented X4 Attention: Sustained Sustained Attention: Appears intact (to verbal) Memory: Appears intact (4 word recall was in average range for age-suspect deficits) Memory Impairment: Storage deficit Awareness: Impaired Awareness Impairment: Anticipatory impairment;Emergent impairment Problem Solving: Impaired Problem Solving Impairment: Verbal complex Executive Function: Organizing;Sequencing;Decision Making Safety/Judgment: Impaired       Comprehension  Auditory Comprehension Overall Auditory Comprehension: Appears within functional limits for tasks assessed Visual Recognition/Discrimination Discrimination: Not tested Reading Comprehension Reading Status: Not tested    Expression Expression Primary Mode of Expression: Verbal Verbal Expression Overall Verbal Expression: Appears within functional limits for tasks assessed Pragmatics: No impairment Written Expression Dominant Hand: Right   Oral / Motor  Oral Motor/Sensory Function Overall Oral Motor/Sensory Function: Within functional limits Motor Speech Overall Motor Speech: Appears within functional limits for tasks assessed   GO                    Houston Siren 04/13/2020, 11:40 AM  Orbie Pyo Colvin Caroli.Ed Risk analyst [phone] Office [phone]

## 2020-04-14 ENCOUNTER — Telehealth: Payer: Self-pay | Admitting: *Deleted

## 2020-04-14 ENCOUNTER — Telehealth: Payer: Self-pay | Admitting: Nurse Practitioner

## 2020-04-14 NOTE — Telephone Encounter (Signed)
Patient daughter is calling and wanted to let Baldo Ash know that patient had a mini stroke and was released yesterday from the doctor. CB is [phone]

## 2020-04-14 NOTE — Telephone Encounter (Signed)
Patient enrolled for [employer] to ship a 30 day cardiac event monitor to her home.  Instructions will be included in her monitor kit.

## 2020-04-14 NOTE — Telephone Encounter (Signed)
FYI

## 2020-04-15 ENCOUNTER — Telehealth: Payer: Self-pay | Admitting: Nurse Practitioner

## 2020-04-15 NOTE — Telephone Encounter (Signed)
Got it.

## 2020-04-15 NOTE — Telephone Encounter (Signed)
Flora is calling to get verbal orders for PT 1 week 1 and 2 week 2. She states that patient declined OT. Please give her a call back at [phone].

## 2020-04-18 NOTE — Telephone Encounter (Signed)
Verbal orders given to Bowles at [facility]

## 2020-04-19 ENCOUNTER — Telehealth: Payer: Self-pay | Admitting: Nurse Practitioner

## 2020-04-19 ENCOUNTER — Other Ambulatory Visit: Payer: Self-pay

## 2020-04-19 NOTE — Telephone Encounter (Signed)
Shannon Morales-[facility] is calling to get verbal orders for Speech Therapy twice a week for 4 weeks. Please call her at [phone].

## 2020-04-19 NOTE — Telephone Encounter (Signed)
Verbal orders given  

## 2020-04-19 NOTE — Telephone Encounter (Signed)
ok 

## 2020-04-20 ENCOUNTER — Ambulatory Visit (INDEPENDENT_AMBULATORY_CARE_PROVIDER_SITE_OTHER): Payer: Medicare Other | Admitting: Nurse Practitioner

## 2020-04-20 ENCOUNTER — Encounter: Payer: Self-pay | Admitting: Nurse Practitioner

## 2020-04-20 VITALS — BP 140/66 | HR 95 | Temp 96.9°F | Ht 60.0 in | Wt 149.0 lb

## 2020-04-20 DIAGNOSIS — R269 Unspecified abnormalities of gait and mobility: Secondary | ICD-10-CM | POA: Diagnosis not present

## 2020-04-20 DIAGNOSIS — I639 Cerebral infarction, unspecified: Secondary | ICD-10-CM

## 2020-04-20 DIAGNOSIS — I69398 Other sequelae of cerebral infarction: Secondary | ICD-10-CM

## 2020-04-20 DIAGNOSIS — I10 Essential (primary) hypertension: Secondary | ICD-10-CM | POA: Diagnosis not present

## 2020-04-20 DIAGNOSIS — I6310 Cerebral infarction due to embolism of unspecified precerebral artery: Secondary | ICD-10-CM

## 2020-04-20 LAB — BASIC METABOLIC PANEL
BUN: 20 mg/dL (ref 6–23)
CO2: 32 mEq/L (ref 19–32)
Calcium: 9.7 mg/dL (ref 8.4–10.5)
Chloride: 102 mEq/L (ref 96–112)
Creatinine, Ser: 1.03 mg/dL (ref 0.40–1.20)
GFR: 46.66 mL/min — ABNORMAL LOW (ref >60.00)
Glucose, Bld: 115 mg/dL — ABNORMAL HIGH (ref 70–99)
Potassium: 3.9 mEq/L (ref 3.5–5.1)
Sodium: 140 mEq/L (ref 135–145)

## 2020-04-20 LAB — CBC
HCT: 39.7 % (ref 36.0–46.0)
Hemoglobin: 13.4 g/dL (ref 12.0–15.0)
MCHC: 33.7 g/dL (ref 30.0–36.0)
MCV: 91.3 fl (ref 78.0–100.0)
Platelets: 250 10*3/uL (ref 150.0–400.0)
RBC: 4.35 Mil/uL (ref 3.87–5.11)
RDW: 13.5 % (ref 11.5–15.5)
WBC: 8.2 10*3/uL (ref 4.0–10.5)

## 2020-04-20 MED ORDER — AMLODIPINE BESYLATE 10 MG PO TABS: 10.0000 mg | ORAL_TABLET | Freq: Every day | ORAL | 3 refills | Status: DC

## 2020-04-20 NOTE — Progress Notes (Signed)
Subjective:  Patient ID: Shannon Morales, female    DOB: 11-Jul-1924  Age: 86 y.o. MRN: [id number]  CC: Hospitalization Follow-up [facility] f/u due to HTN, pt has been checking BP at home since leaving hospital and has recordings with her today. )  HPI accompanied by daughter today.  Admission:04/12/20 Discharge:04/13/20 TCM call completed 04/14/2020 Shannon Morales presented to ED with blurry vision, left facial numbness, dizziness and fall in her tub. She was diagnosed with R. Parietal subacute infarct. She was discharged with ASA and plavix Reviewed lab results, radiology reports, and echocardiogram. With normal echo, a 30days cardiac monitor was ordered. She is still waiting for call from cardiology office. She has an appt with neurology: Dr. Leonie Man 06/27/2020 She is scheduled to start home PT and OT Today she denies any sign GI/GU/nose bleed/abnormal bruising. She lives alone, but her daughter checks on her daily. She admits to presence of area rugs at home. She has grab bars in her bathroom.  BP Readings from Last 3 Encounters:  04/20/20 140/66  04/13/20 (!) 159/71  01/27/20 (!) 148/68   Fall Risk  04/27/2020 01/27/2020  Falls in the past year? 1 0  Number falls in past yr: 1 -  Injury with Fall? 1 -  Risk for fall due to : History of fall(s);Impaired balance/gait -  Follow up Falls evaluation completed;Falls prevention discussed;Education provided;Follow up appointment -   Reviewed past Medical, Social and Family history today.  Outpatient Medications Prior to Visit  Medication Sig Dispense Refill  . aspirin EC 81 MG EC tablet Take 1 tablet (81 mg total) by mouth daily. Swallow whole. 30 tablet 11  . atorvastatin (LIPITOR) 40 MG tablet Take 1 tablet (40 mg total) by mouth daily. 30 tablet 0  . clopidogrel (PLAVIX) 75 MG tablet Take 1 tablet (75 mg total) by mouth daily for 21 days. 21 tablet 0  . levothyroxine (LEVOXYL) 75 MCG tablet Take 1 tablet (75 mcg total) by mouth in the  morning. 90 tablet 1  . potassium chloride (KLOR-CON) 10 MEQ tablet Take 1 tablet (10 mEq total) by mouth in the morning. 90 tablet 1  . valsartan-hydrochlorothiazide (DIOVAN-HCT) 320-12.5 MG tablet Take 1 tablet by mouth daily. 90 tablet 1  . amLODipine (NORVASC) 10 MG tablet Take 1 tablet (10 mg total) by mouth daily. 30 tablet 0   No facility-administered medications prior to visit.    ROS See HPI  Objective:  BP 140/66 (BP Location: Right Arm, Patient Position: Sitting, Cuff Size: Large)   Pulse 95   Temp (!) 96.9 F (36.1 C) (Temporal)   Ht 5' (1.524 m)   Wt 149 lb (67.6 kg)   LMP  (LMP Unknown)   SpO2 99%   BMI 29.10 kg/m   Physical Exam Cardiovascular:     Rate and Rhythm: Normal rate and regular rhythm.     Pulses: Normal pulses.     Heart sounds: Normal heart sounds.  Pulmonary:     Effort: Pulmonary effort is normal.     Breath sounds: Normal breath sounds.  Musculoskeletal:     Right lower leg: No edema.     Left lower leg: No edema.  Skin:    General: Skin is warm and dry.     Findings: No bruising.  Neurological:     Mental Status: She is alert and oriented to person, place, and time.     Cranial Nerves: Cranial nerves are intact.     Motor: Motor function is intact.  No weakness or tremor.     Coordination: Romberg sign positive. Finger-Nose-Finger Test and Heel to [facility] Test normal.     Gait: Gait abnormal and tandem walk abnormal.    Assessment & Plan:  This visit occurred during the SARS-CoV-2 public health emergency.  Safety protocols were in place, including screening questions prior to the visit, additional usage of staff PPE, and extensive cleaning of exam room while observing appropriate contact time as indicated for disinfecting solutions.   Shannon Morales was seen today for hospitalization follow-up.  Diagnoses and all orders for this visit:  Cerebrovascular accident (CVA) due to embolism of precerebral artery (HCC) -     CBC  Essential  hypertension -     Basic metabolic panel -     amLODipine (NORVASC) 10 MG tablet; Take 1 tablet (10 mg total) by mouth daily.  Abnormality of gait following cerebrovascular accident (CVA)   Problem List Items Addressed This Visit      Cardiovascular and Mediastinum   Essential hypertension   Relevant Medications   amLODipine (NORVASC) 10 MG tablet   Other Relevant Orders   Basic metabolic panel (Completed)     Other   Abnormality of gait following cerebrovascular accident (CVA) - Primary    Fall prevention education provided scheduled to start home PT and OT         Follow-up: Return in about 3 months (around 07/21/2020) for HTN.  Wilfred Lacy, NP

## 2020-04-20 NOTE — Patient Instructions (Signed)
Continue current medications Keep appt with neurology Proceed with home PT sessions  Go to lab for blood draw   Fall Prevention in the Home, Adult Falls can cause injuries. They can happen to people of all ages. There are many things you can do to make your home safe and to help prevent falls. Ask for help when making these changes, if needed. What actions can I take to prevent falls? General Instructions  Use good lighting in all rooms. Replace any light bulbs that burn out.  Turn on the lights when you go into a dark area. Use night-lights.  Keep items that you use often in easy-to-reach places. Lower the shelves around your home if necessary.  Set up your furniture so you have a clear path. Avoid moving your furniture around.  Do not have throw rugs and other things on the floor that can make you trip.  Avoid walking on wet floors.  If any of your floors are uneven, fix them.  Add color or contrast paint or tape to clearly mark and help you see: ? Any grab bars or handrails. ? First and last steps of stairways. ? Where the edge of each step is.  If you use a stepladder: ? Make sure that it is fully opened. Do not climb a closed stepladder. ? Make sure that both sides of the stepladder are locked into place. ? Ask someone to hold the stepladder for you while you use it.  If there are any pets around you, be aware of where they are. What can I do in the bathroom?      Keep the floor dry. Clean up any water that spills onto the floor as soon as it happens.  Remove soap buildup in the tub or shower regularly.  Use non-skid mats or decals on the floor of the tub or shower.  Attach bath mats securely with double-sided, non-slip rug tape.  If you need to sit down in the shower, use a plastic, non-slip stool.  Install grab bars by the toilet and in the tub and shower. Do not use towel bars as grab bars. What can I do in the bedroom?  Make sure that you have a light  by your bed that is easy to reach.  Do not use any sheets or blankets that are too big for your bed. They should not hang down onto the floor.  Have a firm chair that has side arms. You can use this for support while you get dressed. What can I do in the kitchen?  Clean up any spills right away.  If you need to reach something above you, use a strong step stool that has a grab bar.  Keep electrical cords out of the way.  Do not use floor polish or wax that makes floors slippery. If you must use wax, use non-skid floor wax. What can I do with my stairs?  Do not leave any items on the stairs.  Make sure that you have a light switch at the top of the stairs and the bottom of the stairs. If you do not have them, ask someone to add them for you.  Make sure that there are handrails on both sides of the stairs, and use them. Fix handrails that are broken or loose. Make sure that handrails are as long as the stairways.  Install non-slip stair treads on all stairs in your home.  Avoid having throw rugs at the top or bottom of the  stairs. If you do have throw rugs, attach them to the floor with carpet tape.  Choose a carpet that does not hide the edge of the steps on the stairway.  Check any carpeting to make sure that it is firmly attached to the stairs. Fix any carpet that is loose or worn. What can I do on the outside of my home?  Use bright outdoor lighting.  Regularly fix the edges of walkways and driveways and fix any cracks.  Remove anything that might make you trip as you walk through a door, such as a raised step or threshold.  Trim any bushes or trees on the path to your home.  Regularly check to see if handrails are loose or broken. Make sure that both sides of any steps have handrails.  Install guardrails along the edges of any raised decks and porches.  Clear walking paths of anything that might make someone trip, such as tools or rocks.  Have any leaves, snow, or ice  cleared regularly.  Use sand or salt on walking paths during winter.  Clean up any spills in your garage right away. This includes grease or oil spills. What other actions can I take?  Wear shoes that: ? Have a low heel. Do not wear high heels. ? Have rubber bottoms. ? Are comfortable and fit you well. ? Are closed at the toe. Do not wear open-toe sandals.  Use tools that help you move around (mobility aids) if they are needed. These include: ? Canes. ? Walkers. ? Scooters. ? Crutches.  Review your medicines with your doctor. Some medicines can make you feel dizzy. This can increase your chance of falling. Ask your doctor what other things you can do to help prevent falls. Where to find more information  Centers for Disease Control and Prevention, STEADI: https://garcia.biz/  [employer] on Aging: BrainJudge.co.uk Contact a doctor if:  You are afraid of falling at home.  You feel weak, drowsy, or dizzy at home.  You fall at home. Summary  There are many simple things that you can do to make your home safe and to help prevent falls.  Ways to make your home safe include removing tripping hazards and installing grab bars in the bathroom.  Ask for help when making these changes in your home. This information is not intended to replace advice given to you by your health care provider. Make sure you discuss any questions you have with your health care provider. Document Revised: 10/09/2018 Document Reviewed: 01/31/2017 Elsevier Patient Education  2020 [employer].

## 2020-04-21 ENCOUNTER — Ambulatory Visit (INDEPENDENT_AMBULATORY_CARE_PROVIDER_SITE_OTHER): Payer: Medicare Other

## 2020-04-21 DIAGNOSIS — I639 Cerebral infarction, unspecified: Secondary | ICD-10-CM

## 2020-04-21 DIAGNOSIS — R42 Dizziness and giddiness: Secondary | ICD-10-CM

## 2020-04-21 DIAGNOSIS — I4891 Unspecified atrial fibrillation: Secondary | ICD-10-CM

## 2020-04-25 ENCOUNTER — Ambulatory Visit: Payer: Self-pay | Admitting: Nurse Practitioner

## 2020-04-25 NOTE — Telephone Encounter (Signed)
Summary: Call back request     Best contact: [phone]  Per agent..  "pt's daughter called requesting help from the hospital. She wants a med alert system installed in the pt's home. She says [place] instructed her to call bc there is a location in [place] does this".   Pt's dtr called on community line.

## 2020-04-26 NOTE — Telephone Encounter (Signed)
She asked me about this during her last OV. I informed her I have no information about this, hence I am not able to be of any assistance.

## 2020-04-26 NOTE — Telephone Encounter (Signed)
Left detailed message informing patient we did not have information regarding what she needed.

## 2020-04-27 NOTE — Assessment & Plan Note (Signed)
Fall prevention education provided scheduled to start home PT and OT

## 2020-04-27 NOTE — Assessment & Plan Note (Signed)
BP improved with valsartan/HCTZ and amlodipine Also taking ASA, plavix, and atovastatin 40mg  Normal echo Pending 30days cardiac monitor. Upcoming appt with neurology.

## 2020-05-04 ENCOUNTER — Ambulatory Visit: Payer: Medicare Other | Admitting: Nurse Practitioner

## 2020-05-09 ENCOUNTER — Other Ambulatory Visit: Payer: Self-pay | Admitting: Nurse Practitioner

## 2020-05-09 DIAGNOSIS — I10 Essential (primary) hypertension: Secondary | ICD-10-CM

## 2020-05-09 NOTE — Telephone Encounter (Signed)
Last OV 04/20/20 Last fill 01/28/20  #90/1

## 2020-05-11 ENCOUNTER — Other Ambulatory Visit: Payer: Self-pay | Admitting: Cardiology

## 2020-05-11 DIAGNOSIS — I639 Cerebral infarction, unspecified: Secondary | ICD-10-CM

## 2020-05-11 DIAGNOSIS — R42 Dizziness and giddiness: Secondary | ICD-10-CM

## 2020-05-11 DIAGNOSIS — I4891 Unspecified atrial fibrillation: Secondary | ICD-10-CM

## 2020-06-27 ENCOUNTER — Other Ambulatory Visit: Payer: Self-pay

## 2020-06-27 ENCOUNTER — Ambulatory Visit: Payer: Medicare Other | Admitting: Neurology

## 2020-06-27 ENCOUNTER — Encounter: Payer: Self-pay | Admitting: Neurology

## 2020-06-27 VITALS — BP 178/79 | HR 98 | Ht 60.0 in | Wt 149.0 lb

## 2020-06-27 DIAGNOSIS — I639 Cerebral infarction, unspecified: Secondary | ICD-10-CM | POA: Diagnosis not present

## 2020-06-27 DIAGNOSIS — H8110 Benign paroxysmal vertigo, unspecified ear: Secondary | ICD-10-CM | POA: Diagnosis not present

## 2020-06-27 NOTE — Progress Notes (Signed)
[facility] [address] [place]. [place] [address] [phone]       OFFICE FOLLOW-UP NOTE  Ms. Shannon Morales Date of Birth:  [birth year] Medical Record Number:  [id number]   HPI: Shannon Morales is a pleasant 86 year old Caucasian lady seen today for initial office follow-up visit following hospital consultation for stroke in October 2021.  She is accompanied by her daughter Shannon Morales.  History is obtained from them, review of electronic medical records and I personally reviewed pertinent imaging films in PACS.  She has past medical history of hypertension, hypothyroidism and stroke.  She presented on 04/12/2020 with sudden onset of dizziness when she tried to sit up in bed the night prior to admission.  She was able to make her room slowly to the bathroom but while urinating fell backwards.  She described initially vertigo and had some nausea but denied vomiting.  She denied any blurred vision, double vision extremity weakness or numbness.  Her blood pressure was found to be elevated at 193/88 on initial admission subsequently came down.  CT scan of the head on admission was unremarkable and subsequently MRI scan showed a small right parietal subacute punctate infarct which was felt to be clinically silent.  Her dizziness was felt to be related to peripheral vestibular dysfunction from benign positional vertigo.  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  Echocardiogram showed normal ejection fraction without cardiac source of embolism.  LDL cholesterol is 84 mg percent.  Hemoglobin A1c is 5.6.  She was seen by physical therapy who recommended vestibular rehab which patient did in it was beneficial and she is no longer having any dizziness of positional vertigo.  She was discharged on aspirin Plavix which she took for 3 weeks and tolerated well and is  currently on aspirin alone and tolerating it well without bruising or bleeding.  She states her blood pressure is usually under good control the today it is elevated in the office at 178/79.  She had over 30-day heart monitor which was negative for paroxysmal A. fib.  She had home physical occupational and speech therapy which is now finished.  She is tolerating Lipitor well without side effects.  She has no new complaints today.  She is pretty independent in all activities of daily living.  ROS:   14 system review of systems is positive for dizziness, vertigo, decreased hearing all other systems negative  PMH:  Past Medical History:  Diagnosis Date  . CVA (cerebral vascular accident) (HCC)   . Hypertension   . Hypothyroidism (acquired)     Social History:  Social History   Socioeconomic History  . Marital status: Single    Spouse name: Not on file  . Number of children: Not on file  . Years of education: Not on file  . Highest education level: Not on file  Occupational History  . Occupation: retired  Tobacco Use  . Smoking status: Never Smoker  . Smokeless tobacco: Never Used  Vaping Use  . Vaping Use: Never used  Substance and Sexual Activity  . Alcohol use: Yes    Comment: occasional wine  . Drug use: Never  . Sexual activity: Not on file  Other Topics Concern  . Not on file  Social History Narrative   Lives alone   Right handed   Drinks 2-3 cups caffeine dailyt   Social Determinants  of Health   Financial Resource Strain: Not on file  Food Insecurity: Not on file  Transportation Needs: Not on file  Physical Activity: Not on file  Stress: Not on file  Social Connections: Not on file  Intimate Partner Violence: Not on file    Medications:   Current Outpatient Medications on File Prior to Visit  Medication Sig Dispense Refill  . aspirin EC 81 MG EC tablet Take 1 tablet (81 mg total) by mouth daily. Swallow whole. 30 tablet 11  . levothyroxine (LEVOXYL) 75 MCG  tablet Take 1 tablet (75 mcg total) by mouth in the morning. 90 tablet 1  . potassium chloride (KLOR-CON) 10 MEQ tablet TAKE 1 TABLET BY MOUTH EVERY MORNING 90 tablet 1  . valsartan-hydrochlorothiazide (DIOVAN-HCT) 320-12.5 MG tablet Take 1 tablet by mouth daily. 90 tablet 1  . amLODipine (NORVASC) 10 MG tablet Take 1 tablet (10 mg total) by mouth daily. 90 tablet 3  . atorvastatin (LIPITOR) 40 MG tablet Take 1 tablet (40 mg total) by mouth daily. 30 tablet 0   No current facility-administered medications on file prior to visit.    Allergies:  No Known Allergies  Physical Exam General: Frail pleasant elderly Caucasian lady, seated, in no evident distress Head: head normocephalic and atraumatic.  Neck: supple with no carotid or supraclavicular bruits Cardiovascular: regular rate and rhythm, no murmurs Musculoskeletal: no deformity Skin:  no rash/petichiae Vascular:  Normal pulses all extremities Vitals:   06/27/20 0856  BP: (!) 178/79  Pulse: 98   Neurologic Exam Mental Status: Awake and fully alert. Oriented to place and time. Recent and remote memory intact. Attention span, concentration and fund of knowledge appropriate. Mood and affect appropriate.  Cranial Nerves: Fundoscopic exam reveals sharp disc margins. Pupils equal, briskly reactive to light. Extraocular movements full without nystagmus. Visual fields full to confrontation. Hearing diminished bilaterally. Facial sensation intact. Face, tongue, palate moves normally and symmetrically.  Motor: Normal bulk and tone. Normal strength in all tested extremity muscles. Sensory.: intact to touch ,pinprick .position and vibratory sensation.  Coordination: Rapid alternating movements normal in all extremities. Finger-to-nose and heel-to-shin performed accurately bilaterally. Gait and Station: Arises from chair without difficulty. Stance is normal. Gait demonstrates normal stride length and balance .  Unable to do tandem walking.   Reflexes: 1+ and symmetric. Toes downgoing.   NIHSS  0 Modified Rankin  0   ASSESSMENT: 86 year old pleasant Caucasian lady with transient positional dizziness likely due to benign paroxysmal positional vertigo which appears to have resolved.  Silent right parietal infarct in October 2021.  Vascular risk factors of hypertension hyperlipidemia and age     PLAN: I had a long d/w patient and her daughter about her silent cryptogenic stroke and benign positional vertigo  , risk for recurrent stroke/TIAs, personally independently reviewed imaging studies and stroke evaluation results and answered questions.Continue aspirin 81 mg daily  for secondary stroke prevention and maintain strict control of hypertension with blood pressure goal below 130/90, diabetes with hemoglobin A1c goal below 6.5% and lipids with LDL cholesterol goal below 70 mg/dL. I also advised the patient to eat a healthy diet with plenty of whole grains, cereals, fruits and vegetables, exercise regularly and maintain ideal body weight I encouraged her to do regular vestibular stabilization exercises at home..Followup in the future with my nurse practitioner Janett Billow in 6 months or call earlier if necessary. Greater than 50% of time during this 30 5 minute visit was spent on counseling,explanation of diagnosis, planning of further  management, discussion with patient and family and coordination of care Antony Contras, MD Note: This document was prepared with digital dictation and possible smart phrase technology. Any transcriptional errors that result from this process are unintentional

## 2020-06-27 NOTE — Patient Instructions (Signed)
I had a long d/w patient and her daughter about her silent cryptogenic stroke and benign positional vertigo  , risk for recurrent stroke/TIAs, personally independently reviewed imaging studies and stroke evaluation results and answered questions.Continue aspirin 81 mg daily  for secondary stroke prevention and maintain strict control of hypertension with blood pressure goal below 130/90, diabetes with hemoglobin A1c goal below 6.5% and lipids with LDL cholesterol goal below 70 mg/dL. I also advised the patient to eat a healthy diet with plenty of whole grains, cereals, fruits and vegetables, exercise regularly and maintain ideal body weight I encouraged her to do regular vestibular stabilization exercises at home..Followup in the future with my nurse practitioner Shanda Bumps in 6 months or call earlier if necessary.  Stroke Prevention Some medical conditions and behaviors are associated with a higher chance of having a stroke. You can help prevent a stroke by making nutrition, lifestyle, and other changes, including managing any medical conditions you may have. What nutrition changes can be made?   Eat healthy foods. You can do this by: ? Choosing foods high in fiber, such as fresh fruits and vegetables and whole grains. ? Eating at least 5 or more servings of fruits and vegetables a day. Try to fill half of your plate at each meal with fruits and vegetables. ? Choosing lean protein foods, such as lean cuts of meat, poultry without skin, fish, tofu, beans, and nuts. ? Eating low-fat dairy products. ? Avoiding foods that are high in salt (sodium). This can help lower blood pressure. ? Avoiding foods that have saturated fat, trans fat, and cholesterol. This can help prevent high cholesterol. ? Avoiding processed and premade foods.  Follow your health care provider's specific guidelines for losing weight, controlling high blood pressure (hypertension), lowering high cholesterol, and managing diabetes. These  may include: ? Reducing your daily calorie intake. ? Limiting your daily sodium intake to 1,500 milligrams (mg). ? Using only healthy fats for cooking, such as olive oil, canola oil, or sunflower oil. ? Counting your daily carbohydrate intake. What lifestyle changes can be made?  Maintain a healthy weight. Talk to your health care provider about your ideal weight.  Get at least 30 minutes of moderate physical activity at least 5 days a week. Moderate activity includes brisk walking, biking, and swimming.  Do not use any products that contain nicotine or tobacco, such as cigarettes and e-cigarettes. If you need help quitting, ask your health care provider. It may also be helpful to avoid exposure to secondhand smoke.  Limit alcohol intake to no more than 1 drink a day for nonpregnant women and 2 drinks a day for men. One drink equals 12 oz of beer, 5 oz of wine, or 1 oz of hard liquor.  Stop any illegal drug use.  Avoid taking birth control pills. Talk to your health care provider about the risks of taking birth control pills if: ? You are over 61 years old. ? You smoke. ? You get migraines. ? You have ever had a blood clot. What other changes can be made?  Manage your cholesterol levels. ? Eating a healthy diet is important for preventing high cholesterol. If cholesterol cannot be managed through diet alone, you may also need to take medicines. ? Take any prescribed medicines to control your cholesterol as told by your health care provider.  Manage your diabetes. ? Eating a healthy diet and exercising regularly are important parts of managing your blood sugar. If your blood sugar cannot be managed  through diet and exercise, you may need to take medicines. ? Take any prescribed medicines to control your diabetes as told by your health care provider.  Control your hypertension. ? To reduce your risk of stroke, try to keep your blood pressure below 130/80. ? Eating a healthy diet and  exercising regularly are an important part of controlling your blood pressure. If your blood pressure cannot be managed through diet and exercise, you may need to take medicines. ? Take any prescribed medicines to control hypertension as told by your health care provider. ? Ask your health care provider if you should monitor your blood pressure at home. ? Have your blood pressure checked every year, even if your blood pressure is normal. Blood pressure increases with age and some medical conditions.  Get evaluated for sleep disorders (sleep apnea). Talk to your health care provider about getting a sleep evaluation if you snore a lot or have excessive sleepiness.  Take over-the-counter and prescription medicines only as told by your health care provider. Aspirin or blood thinners (antiplatelets or anticoagulants) may be recommended to reduce your risk of forming blood clots that can lead to stroke.  Make sure that any other medical conditions you have, such as atrial fibrillation or atherosclerosis, are managed. What are the warning signs of a stroke? The warning signs of a stroke can be easily remembered as BEFAST.  B is for balance. Signs include: ? Dizziness. ? Loss of balance or coordination. ? Sudden trouble walking.  E is for eyes. Signs include: ? A sudden change in vision. ? Trouble seeing.  F is for face. Signs include: ? Sudden weakness or numbness of the face. ? The face or eyelid drooping to one side.  A is for arms. Signs include: ? Sudden weakness or numbness of the arm, usually on one side of the body.  S is for speech. Signs include: ? Trouble speaking (aphasia). ? Trouble understanding.  T is for time. ? These symptoms may represent a serious problem that is an emergency. Do not wait to see if the symptoms will go away. Get medical help right away. Call your local emergency services (911 in the [place] Do not drive yourself to the hospital.  Other signs of stroke  may include: ? A sudden, severe headache with no known cause. ? Nausea or vomiting. ? Seizure. Where to find more information For more information, visit:  American Stroke Association: www.strokeassociation.org  National Stroke Association: www.stroke.org Summary  You can prevent a stroke by eating healthy, exercising, not smoking, limiting alcohol intake, and managing any medical conditions you may have.  Do not use any products that contain nicotine or tobacco, such as cigarettes and e-cigarettes. If you need help quitting, ask your health care provider. It may also be helpful to avoid exposure to secondhand smoke.  Remember BEFAST for warning signs of stroke. Get help right away if you or a loved one has any of these signs. This information is not intended to replace advice given to you by your health care provider. Make sure you discuss any questions you have with your health care provider. Document Revised: 05/31/2017 Document Reviewed: 07/24/2016 Elsevier Patient Education  2020 [employer].

## 2020-07-07 ENCOUNTER — Telehealth: Payer: Self-pay | Admitting: Nurse Practitioner

## 2020-07-07 NOTE — Telephone Encounter (Signed)
Attempted to schedule AWV. Unable to LVM.  Will try at later time.  

## 2020-07-20 ENCOUNTER — Ambulatory Visit: Payer: Medicare Other | Admitting: Nurse Practitioner

## 2020-08-01 ENCOUNTER — Other Ambulatory Visit: Payer: Self-pay | Admitting: Nurse Practitioner

## 2020-08-01 DIAGNOSIS — I10 Essential (primary) hypertension: Secondary | ICD-10-CM

## 2020-08-01 DIAGNOSIS — E039 Hypothyroidism, unspecified: Secondary | ICD-10-CM

## 2020-09-06 ENCOUNTER — Other Ambulatory Visit: Payer: Self-pay

## 2020-09-07 ENCOUNTER — Ambulatory Visit (INDEPENDENT_AMBULATORY_CARE_PROVIDER_SITE_OTHER): Payer: Medicare Other | Admitting: Nurse Practitioner

## 2020-09-07 ENCOUNTER — Encounter: Payer: Self-pay | Admitting: Nurse Practitioner

## 2020-09-07 VITALS — BP 140/72 | HR 96 | Temp 97.0°F | Ht 60.0 in | Wt 150.6 lb

## 2020-09-07 DIAGNOSIS — E039 Hypothyroidism, unspecified: Secondary | ICD-10-CM

## 2020-09-07 DIAGNOSIS — I10 Essential (primary) hypertension: Secondary | ICD-10-CM

## 2020-09-07 MED ORDER — AMLODIPINE BESYLATE 10 MG PO TABS: 10.0000 mg | ORAL_TABLET | Freq: Every day | ORAL | 3 refills | Status: DC

## 2020-09-07 MED ORDER — VALSARTAN-HYDROCHLOROTHIAZIDE 320-12.5 MG PO TABS: ORAL_TABLET | ORAL | 1 refills | Status: DC

## 2020-09-07 NOTE — Patient Instructions (Signed)
Use debrox solution 5drops at bedtime for next 2days.  return to office if Friday for ear irrigation.  Go to lab for blood draw

## 2020-09-07 NOTE — Assessment & Plan Note (Addendum)
Repeat TSH and T4: Stable renal and liver function Persistent elevated TSH and normal T4: increase levothyroxine to 169mcg. This should be taken on an empty stomach and separate from other medications. Return to lab in 6weeks for repeat labs. Stable weight No change in bowel pattern per patient No change in mood or mental status per daughter.

## 2020-09-07 NOTE — Progress Notes (Signed)
Subjective:  Patient ID: Shannon Morales, female    DOB: Dec 19, 1924  Age: 87 y.o. MRN: [id number]  CC: Follow-up (3 month f/u on HTN. Pt has been keeping a log of BP and has brought it in today. Ranging 130s-170s/60s-80s. Declines flu vaccine)  HPI Accompanied by daughter.  Essential hypertension Stable BP with amlodipine and Diovan/hctz BP Readings from Last 3 Encounters:  09/07/20 140/72  06/27/20 (!) 178/79  04/20/20 140/66   Repeat BMP Maintain current medications  Hypothyroidism (acquired) Repeat TSH and T4: Stable renal and liver function Persistent elevated TSH and normal T4: increase levothyroxine to 160mcg. This should be taken on an empty stomach and separate from other medications. Return to lab in 6weeks for repeat labs. Stable weight No change in bowel pattern per patient No change in mood or mental status per daughter.  BP Readings from Last 3 Encounters:  09/07/20 140/72  06/27/20 (!) 178/79  04/20/20 140/66   Wt Readings from Last 3 Encounters:  09/07/20 150 lb 9.6 oz (68.3 kg)  06/27/20 149 lb (67.6 kg)  04/20/20 149 lb (67.6 kg)   Reviewed past Medical, Social and Family history today.  Outpatient Medications Prior to Visit  Medication Sig Dispense Refill  . aspirin EC 81 MG EC tablet Take 1 tablet (81 mg total) by mouth daily. Swallow whole. 30 tablet 11  . potassium chloride (KLOR-CON) 10 MEQ tablet TAKE 1 TABLET BY MOUTH EVERY MORNING 90 tablet 1  . levothyroxine (SYNTHROID) 75 MCG tablet TAKE 1 TABLET BY MOUTH EVERY MORNING 90 tablet 1  . valsartan-hydrochlorothiazide (DIOVAN-HCT) 320-12.5 MG tablet TAKE ONE (1) TABLET BY MOUTH EVERY DAY 90 tablet 1  . atorvastatin (LIPITOR) 40 MG tablet Take 1 tablet (40 mg total) by mouth daily. 30 tablet 0  . amLODipine (NORVASC) 10 MG tablet Take 1 tablet (10 mg total) by mouth daily. 90 tablet 3   No facility-administered medications prior to visit.    ROS See HPI  Objective:  BP 140/72 (BP  Location: Left Arm, Patient Position: Sitting, Cuff Size: Normal)   Pulse 96   Temp (!) 97 F (36.1 C) (Temporal)   Ht 5' (1.524 m)   Wt 150 lb 9.6 oz (68.3 kg)   SpO2 96%   BMI 29.41 kg/m   Physical Exam Vitals reviewed.  Cardiovascular:     Rate and Rhythm: Normal rate and regular rhythm.     Pulses: Normal pulses.     Heart sounds: Normal heart sounds.  Pulmonary:     Effort: Pulmonary effort is normal.     Breath sounds: Normal breath sounds.  Musculoskeletal:     Right lower leg: No edema.     Left lower leg: No edema.  Neurological:     Mental Status: She is alert and oriented to person, place, and time.  Psychiatric:        Mood and Affect: Mood normal.        Behavior: Behavior normal.        Thought Content: Thought content normal.    Assessment & Plan:  This visit occurred during the SARS-CoV-2 public health emergency.  Safety protocols were in place, including screening questions prior to the visit, additional usage of staff PPE, and extensive cleaning of exam room while observing appropriate contact time as indicated for disinfecting solutions.   Perian was seen today for follow-up.  Diagnoses and all orders for this visit:  Hypothyroidism (acquired) -     TSH -  T4, free -     Hepatic function panel  Essential hypertension -     Basic metabolic panel -     amLODipine (NORVASC) 10 MG tablet; Take 1 tablet (10 mg total) by mouth daily. -     valsartan-hydrochlorothiazide (DIOVAN-HCT) 320-12.5 MG tablet; TAKE ONE (1) TABLET BY MOUTH EVERY DAY  Hypothyroidism, unspecified type -     levothyroxine (SYNTHROID) 100 MCG tablet; Take 1 tablet (100 mcg total) by mouth in the morning. On empty stomach, 35mins before food and other medications   Problem List Items Addressed This Visit      Cardiovascular and Mediastinum   Essential hypertension    Stable BP with amlodipine and Diovan/hctz BP Readings from Last 3 Encounters:  09/07/20 140/72  06/27/20 (!)  178/79  04/20/20 140/66   Repeat BMP Maintain current medications      Relevant Medications   amLODipine (NORVASC) 10 MG tablet   valsartan-hydrochlorothiazide (DIOVAN-HCT) 320-12.5 MG tablet   Other Relevant Orders   Basic metabolic panel (Completed)     Endocrine   Hypothyroidism (acquired) - Primary    Repeat TSH and T4: Stable renal and liver function Persistent elevated TSH and normal T4: increase levothyroxine to 135mcg. This should be taken on an empty stomach and separate from other medications. Return to lab in 6weeks for repeat labs. Stable weight No change in bowel pattern per patient No change in mood or mental status per daughter.      Relevant Medications   levothyroxine (SYNTHROID) 100 MCG tablet   Other Relevant Orders   TSH (Completed)   T4, free (Completed)   Hepatic function panel (Completed)    Other Visit Diagnoses    Hypothyroidism, unspecified type       Relevant Medications   levothyroxine (SYNTHROID) 100 MCG tablet      Follow-up: Return in about 6 months (around 03/10/2021) for HTN.  Wilfred Lacy, NP

## 2020-09-07 NOTE — Assessment & Plan Note (Signed)
Stable BP with amlodipine and Diovan/hctz BP Readings from Last 3 Encounters:  09/07/20 140/72  06/27/20 (!) 178/79  04/20/20 140/66   Repeat BMP Maintain current medications

## 2020-09-08 LAB — HEPATIC FUNCTION PANEL
ALT: 13 U/L (ref 0–35)
AST: 20 U/L (ref 0–37)
Albumin: 4.3 g/dL (ref 3.5–5.2)
Alkaline Phosphatase: 47 U/L (ref 39–117)
Bilirubin, Direct: 0.1 mg/dL (ref 0.0–0.3)
Total Bilirubin: 0.6 mg/dL (ref 0.2–1.2)
Total Protein: 7.4 g/dL (ref 6.0–8.3)

## 2020-09-08 LAB — BASIC METABOLIC PANEL
BUN: 18 mg/dL (ref 6–23)
CO2: 34 mEq/L — ABNORMAL HIGH (ref 19–32)
Calcium: 9.9 mg/dL (ref 8.4–10.5)
Chloride: 101 mEq/L (ref 96–112)
Creatinine, Ser: 1.13 mg/dL (ref 0.40–1.20)
GFR: 41.83 mL/min — ABNORMAL LOW (ref >60.00)
Glucose, Bld: 104 mg/dL — ABNORMAL HIGH (ref 70–99)
Potassium: 4.4 mEq/L (ref 3.5–5.1)
Sodium: 140 mEq/L (ref 135–145)

## 2020-09-08 LAB — TSH: TSH: 7.76 u[IU]/mL — ABNORMAL HIGH (ref 0.35–4.50)

## 2020-09-08 LAB — T4, FREE: Free T4: 1.13 ng/dL (ref 0.60–1.60)

## 2020-09-08 MED ORDER — LEVOTHYROXINE SODIUM 100 MCG PO TABS: 100.0000 ug | ORAL_TABLET | Freq: Every morning | ORAL | 1 refills | Status: DC

## 2020-09-08 NOTE — Addendum Note (Signed)
Addended by: Leana Gamer on: 09/08/2020 04:34 PM   Modules accepted: Orders

## 2020-09-09 ENCOUNTER — Ambulatory Visit (INDEPENDENT_AMBULATORY_CARE_PROVIDER_SITE_OTHER): Payer: Medicare Other | Admitting: Nurse Practitioner

## 2020-09-09 ENCOUNTER — Encounter: Payer: Self-pay | Admitting: Nurse Practitioner

## 2020-09-09 ENCOUNTER — Other Ambulatory Visit: Payer: Self-pay

## 2020-09-09 VITALS — BP 140/82 | HR 82 | Ht 60.0 in | Wt 150.0 lb

## 2020-09-09 DIAGNOSIS — H6122 Impacted cerumen, left ear: Secondary | ICD-10-CM | POA: Diagnosis not present

## 2020-09-09 NOTE — Progress Notes (Signed)
   Subjective:  Patient ID: Gray Bernhardt, female    DOB: 04/02/1925  Age: 87 y.o. MRN: [id number]  CC: Acute Visit (Pt c/o left ear being clogged. )  HPI Accompanied by son. Ms. Luft is here for cerumen removal after using debrox solution x 2days. She denies and ear pain or dizziness. She has noticed decreased hearing in left ear despite change in hearing aids  Reviewed past Medical, Social and Family history today.  Outpatient Medications Prior to Visit  Medication Sig Dispense Refill  . amLODipine (NORVASC) 10 MG tablet Take 1 tablet (10 mg total) by mouth daily. 90 tablet 3  . aspirin EC 81 MG EC tablet Take 1 tablet (81 mg total) by mouth daily. Swallow whole. 30 tablet 11  . levothyroxine (SYNTHROID) 100 MCG tablet Take 1 tablet (100 mcg total) by mouth in the morning. On empty stomach, 69mins before food and other medications 90 tablet 1  . potassium chloride (KLOR-CON) 10 MEQ tablet TAKE 1 TABLET BY MOUTH EVERY MORNING 90 tablet 1  . valsartan-hydrochlorothiazide (DIOVAN-HCT) 320-12.5 MG tablet TAKE ONE (1) TABLET BY MOUTH EVERY DAY 90 tablet 1  . atorvastatin (LIPITOR) 40 MG tablet Take 1 tablet (40 mg total) by mouth daily. 30 tablet 0   No facility-administered medications prior to visit.    ROS See HPI  Objective:  BP 140/82 (BP Location: Left Arm, Patient Position: Sitting, Cuff Size: Normal)   Pulse 82   Ht 5' (1.524 m)   Wt 150 lb (68 kg)   SpO2 98%   BMI 29.29 kg/m   Physical Exam HENT:     Right Ear: Tympanic membrane, ear canal and external ear normal.     Left Ear: There is impacted cerumen.     Ears:     Comments: Normal ear canal and TM after cerumen removal Skin:    Findings: No rash.  Neurological:     Mental Status: She is alert and oriented to person, place, and time.  Psychiatric:        Mood and Affect: Mood normal.        Behavior: Behavior normal.   Procedure Note :    Procedure :  Ear irrigation (left)  Indication:  Decreased  hearing due to Cerumen impaction (left ear)  Risks, including pain, dizziness, eardrum perforation, bleeding, infection and others as well as benefits were explained to the patient in detail. Verbal consent was obtained and the patient agreed to proceed.   We used "The Elephant Ear Irrigation Device" filled with lukewarm water for irrigation. A large amount wax was recovered. Procedure has also required manual wax removal with an ear loop.  Tolerated well. Complications: None.  Postprocedure instructions :  Call if problems.  Assessment & Plan:  This visit occurred during the SARS-CoV-2 public health emergency.  Safety protocols were in place, including screening questions prior to the visit, additional usage of staff PPE, and extensive cleaning of exam room while observing appropriate contact time as indicated for disinfecting solutions.   Latandra was seen today for acute visit.  Diagnoses and all orders for this visit:  Hearing loss due to cerumen impaction, left   Problem List Items Addressed This Visit   None   Visit Diagnoses    Hearing loss due to cerumen impaction, left    -  Primary      Follow-up: No follow-ups on file.  Wilfred Lacy, NP

## 2020-12-26 ENCOUNTER — Ambulatory Visit: Payer: Medicare Other | Admitting: Neurology

## 2021-02-07 DIAGNOSIS — D3131 Benign neoplasm of right choroid: Secondary | ICD-10-CM | POA: Insufficient documentation

## 2021-02-07 DIAGNOSIS — Z961 Presence of intraocular lens: Secondary | ICD-10-CM | POA: Insufficient documentation

## 2021-02-07 DIAGNOSIS — H353132 Nonexudative age-related macular degeneration, bilateral, intermediate dry stage: Secondary | ICD-10-CM | POA: Insufficient documentation

## 2021-03-03 ENCOUNTER — Telehealth: Payer: Self-pay

## 2021-03-03 NOTE — Telephone Encounter (Signed)
Spoke to patient's daughter, Juliann Pulse, she is declining to do the AWV per patients request.  Dm/cma

## 2021-03-15 ENCOUNTER — Other Ambulatory Visit: Payer: Self-pay

## 2021-03-15 ENCOUNTER — Ambulatory Visit (INDEPENDENT_AMBULATORY_CARE_PROVIDER_SITE_OTHER): Payer: Medicare Other | Admitting: Nurse Practitioner

## 2021-03-15 ENCOUNTER — Encounter: Payer: Self-pay | Admitting: Nurse Practitioner

## 2021-03-15 VITALS — BP 112/60 | HR 78 | Temp 96.8°F | Ht 60.0 in | Wt 149.0 lb

## 2021-03-15 DIAGNOSIS — Z23 Encounter for immunization: Secondary | ICD-10-CM

## 2021-03-15 DIAGNOSIS — E039 Hypothyroidism, unspecified: Secondary | ICD-10-CM | POA: Diagnosis not present

## 2021-03-15 DIAGNOSIS — R739 Hyperglycemia, unspecified: Secondary | ICD-10-CM | POA: Diagnosis not present

## 2021-03-15 DIAGNOSIS — I10 Essential (primary) hypertension: Secondary | ICD-10-CM

## 2021-03-15 MED ORDER — AMLODIPINE BESYLATE 10 MG PO TABS: 10.0000 mg | ORAL_TABLET | Freq: Every day | ORAL | 3 refills | Status: DC

## 2021-03-15 MED ORDER — VALSARTAN-HYDROCHLOROTHIAZIDE 320-12.5 MG PO TABS
ORAL_TABLET | ORAL | 1 refills | Status: DC
Start: 2021-03-15 — End: 2021-09-12

## 2021-03-15 MED ORDER — POTASSIUM CHLORIDE ER 10 MEQ PO TBCR: 10.0000 meq | EXTENDED_RELEASE_TABLET | Freq: Every morning | ORAL | 1 refills | Status: DC

## 2021-03-15 NOTE — Progress Notes (Signed)
Subjective:  Patient ID: Shannon Morales, female    DOB: 05-23-1925  Age: 87 y.o. MRN: [id number]  CC: Follow-up (6 month f/u on HTN. Pt checks BP at home and it ranges 120s-140s/60s-70s)  HPI Accompanied by grand-daughter.  Hypothyroidism (acquired) asymptomatic Repeat TSH and T4 today: elevated TSH and normal T4 Increase levothyroxine dose to 174mg Repeat labs in 238month Essential hypertension BP at goal No adverse side effects with amlodipine and valsartan/HCTZ BP Readings from Last 3 Encounters:  03/15/21 112/60  09/09/20 140/82  09/07/20 140/72   Repeat BMP: stable Maintain current medication doses F/up in 24m9monthP Readings from Last 3 Encounters:  03/15/21 112/60  09/09/20 140/82  09/07/20 140/72    Reviewed past Medical, Social and Family history today.  Outpatient Medications Prior to Visit  Medication Sig Dispense Refill   aspirin EC 81 MG EC tablet Take 1 tablet (81 mg total) by mouth daily. Swallow whole. 30 tablet 11   levothyroxine (SYNTHROID) 100 MCG tablet Take 1 tablet (100 mcg total) by mouth in the morning. On empty stomach, 58m524mbefore food and other medications 90 tablet 1   potassium chloride (KLOR-CON) 10 MEQ tablet TAKE 1 TABLET BY MOUTH EVERY MORNING 90 tablet 1   valsartan-hydrochlorothiazide (DIOVAN-HCT) 320-12.5 MG tablet TAKE ONE (1) TABLET BY MOUTH EVERY DAY 90 tablet 1   atorvastatin (LIPITOR) 40 MG tablet Take 1 tablet (40 mg total) by mouth daily. 30 tablet 0   amLODipine (NORVASC) 10 MG tablet Take 1 tablet (10 mg total) by mouth daily. 90 tablet 3   No facility-administered medications prior to visit.    ROS See HPI  Objective:  BP 112/60 (BP Location: Right Arm, Patient Position: Sitting, Cuff Size: Normal)   Pulse 78   Temp (!) 96.8 F (36 C) (Temporal)   Ht 5' (1.524 m)   Wt 149 lb (67.6 kg)   SpO2 98%   BMI 29.10 kg/m   Physical Exam Vitals reviewed.  Cardiovascular:     Rate and Rhythm: Normal rate and  regular rhythm.     Pulses: Normal pulses.     Heart sounds:    Gallop present. S4 sounds present. No S3 sounds.  Pulmonary:     Effort: Pulmonary effort is normal.     Breath sounds: Normal breath sounds.  Musculoskeletal:     Right lower leg: No edema.     Left lower leg: No edema.  Neurological:     Mental Status: She is alert and oriented to person, place, and time.  Psychiatric:        Mood and Affect: Mood normal.        Behavior: Behavior normal.        Thought Content: Thought content normal.   Assessment & Plan:  This visit occurred during the SARS-CoV-2 public health emergency.  Safety protocols were in place, including screening questions prior to the visit, additional usage of staff PPE, and extensive cleaning of exam room while observing appropriate contact time as indicated for disinfecting solutions.   Tran was seen today for follow-up.  Diagnoses and all orders for this visit:  Essential hypertension -     Basic metabolic panel -     amLODipine (NORVASC) 10 MG tablet; Take 1 tablet (10 mg total) by mouth daily. -     valsartan-hydrochlorothiazide (DIOVAN-HCT) 320-12.5 MG tablet; TAKE ONE (1) TABLET BY MOUTH EVERY DAY -     potassium chloride (KLOR-CON) 10 MEQ tablet; Take 1 tablet (10  mEq total) by mouth every morning.  Flu vaccine need -     Flu Vaccine QUAD High Dose(Fluad)  Hypothyroidism (acquired) -     T4, free -     TSH -     levothyroxine (SYNTHROID) 125 MCG tablet; Take 1 tablet (125 mcg total) by mouth in the morning. On empty stomach, 34mns before food and other medications -     Thyroid Panel With TSH; Future  Hyperglycemia -     Hemoglobin A1c   Problem List Items Addressed This Visit       Cardiovascular and Mediastinum   Essential hypertension - Primary    BP at goal No adverse side effects with amlodipine and valsartan/HCTZ BP Readings from Last 3 Encounters:  03/15/21 112/60  09/09/20 140/82  09/07/20 140/72   Repeat BMP:  stable Maintain current medication doses F/up in 65month     Relevant Medications   amLODipine (NORVASC) 10 MG tablet   valsartan-hydrochlorothiazide (DIOVAN-HCT) 320-12.5 MG tablet   potassium chloride (KLOR-CON) 10 MEQ tablet   Other Relevant Orders   Basic metabolic panel (Completed)     Endocrine   Hypothyroidism (acquired)    asymptomatic Repeat TSH and T4 today: elevated TSH and normal T4 Increase levothyroxine dose to 12574mRepeat labs in 17mo1month   Relevant Medications   levothyroxine (SYNTHROID) 125 MCG tablet   Other Relevant Orders   T4, free (Completed)   TSH (Completed)   Thyroid Panel With TSH   Other Visit Diagnoses     Flu vaccine need       Relevant Orders   Flu Vaccine QUAD High Dose(Fluad)   Hyperglycemia       Relevant Orders   Hemoglobin A1c (Completed)       Follow-up: Return in about 6 months (around 09/12/2021) for HTN and hypothyroidism.  CharWilfred Lacy

## 2021-03-15 NOTE — Assessment & Plan Note (Addendum)
asymptomatic Repeat TSH and T4 today: elevated TSH and normal T4 Increase levothyroxine dose to 144mg Repeat labs in 220month

## 2021-03-15 NOTE — Assessment & Plan Note (Addendum)
BP at goal No adverse side effects with amlodipine and valsartan/HCTZ BP Readings from Last 3 Encounters:  03/15/21 112/60  09/09/20 140/82  09/07/20 140/72   Repeat BMP: stable Maintain current medication doses F/up in 68month

## 2021-03-15 NOTE — Patient Instructions (Signed)
Go to lab for blood draw.  Maintain current medications  Use claritin '10mg'$  at bedtime to help with postnasal drainage. Call office if no improvement in 1week.  Please bring a copy of advanced directive (living will and HCPOA).

## 2021-03-16 LAB — BASIC METABOLIC PANEL
BUN: 19 mg/dL (ref 6–23)
CO2: 32 mEq/L (ref 19–32)
Calcium: 9.7 mg/dL (ref 8.4–10.5)
Chloride: 100 mEq/L (ref 96–112)
Creatinine, Ser: 1.12 mg/dL (ref 0.40–1.20)
GFR: 42.13 mL/min — ABNORMAL LOW (ref >60.00)
Glucose, Bld: 110 mg/dL — ABNORMAL HIGH (ref 70–99)
Potassium: 3.8 mEq/L (ref 3.5–5.1)
Sodium: 140 mEq/L (ref 135–145)

## 2021-03-16 LAB — T4, FREE: Free T4: 0.67 ng/dL (ref 0.60–1.60)

## 2021-03-16 LAB — TSH: TSH: 19.02 u[IU]/mL — ABNORMAL HIGH (ref 0.35–5.50)

## 2021-03-16 LAB — HEMOGLOBIN A1C: Hgb A1c MFr Bld: 6 % (ref 4.6–6.5)

## 2021-03-17 MED ORDER — LEVOTHYROXINE SODIUM 125 MCG PO TABS: 125.0000 ug | ORAL_TABLET | Freq: Every morning | ORAL | 1 refills | Status: DC

## 2021-03-23 ENCOUNTER — Encounter: Payer: Self-pay | Admitting: Neurology

## 2021-03-23 ENCOUNTER — Ambulatory Visit: Payer: Medicare Other | Admitting: Neurology

## 2021-03-23 VITALS — BP 137/73 | HR 83 | Ht 60.0 in | Wt 149.0 lb

## 2021-03-23 DIAGNOSIS — R0989 Other specified symptoms and signs involving the circulatory and respiratory systems: Secondary | ICD-10-CM

## 2021-03-23 DIAGNOSIS — Z8673 Personal history of transient ischemic attack (TIA), and cerebral infarction without residual deficits: Secondary | ICD-10-CM | POA: Diagnosis not present

## 2021-03-23 NOTE — Patient Instructions (Signed)
I had a long d/w patient about remote TIAs , risk for recurrent stroke/TIAs, personally independently reviewed imaging studies and stroke evaluation results and answered questions.Continue aspirin 81 mg daily  for secondary stroke prevention and maintain strict control of hypertension with blood pressure goal below 130/90, diabetes with hemoglobin A1c goal below 6.5% and lipids with LDL cholesterol goal below 70 mg/dL. I also advised the patient to eat a healthy diet with plenty of whole grains, cereals, fruits and vegetables, exercise regularly and maintain ideal body weight .check follow-up screening carotid ultrasound study.  Followup in the future with me in 1 year or call earlier if necessary.

## 2021-03-23 NOTE — Progress Notes (Signed)
[facility] [address] [place]. [place] [address] [phone]       OFFICE FOLLOW-UP NOTE  Shannon Morales Date of Birth:  10-17-1924 Medical Record Number:  [id number]   HPI: Shannon Morales is a pleasant 87 year old Caucasian lady seen today for initial office follow-up visit following hospital consultation for stroke in October 2021.  She is accompanied by her daughter Shannon Morales.  History is obtained from them, review of electronic medical records and I personally reviewed pertinent imaging films in PACS.  She has past medical history of hypertension, hypothyroidism and stroke.  She presented on 04/12/2020 with sudden onset of dizziness when she tried to sit up in bed the night prior to admission.  She was able to make her room slowly to the bathroom but while urinating fell backwards.  She described initially vertigo and had some nausea but denied vomiting.  She denied any blurred vision, double vision extremity weakness or numbness.  Her blood pressure was found to be elevated at 193/88 on initial admission subsequently came down.  CT scan of the head on admission was unremarkable and subsequently MRI scan showed a small right parietal subacute punctate infarct which was felt to be clinically silent.  Her dizziness was felt to be related to peripheral vestibular dysfunction from benign positional vertigo.  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  Echocardiogram showed normal ejection fraction without cardiac source of embolism.  LDL cholesterol is 84 mg percent.  Hemoglobin A1c is 5.6.  She was seen by physical therapy who recommended vestibular rehab which patient did in it was beneficial and she is no longer having any dizziness of positional vertigo.  She was discharged on aspirin Plavix which she took for 3 weeks and tolerated well and is  currently on aspirin alone and tolerating it well without bruising or bleeding.  She states her blood pressure is usually under good control the today it is elevated in the office at 178/79.  She had over 30-day heart monitor which was negative for paroxysmal A. fib.  She had home physical occupational and speech therapy which is now finished.  She is tolerating Lipitor well without side effects.  She has no new complaints today.  She is pretty independent in all activities of daily living. Update 03/23/2021 ; She returns for follow-up after last visit 9 months ago.  She is accompanied by her daughter.  Patient continues to do well.  She has had no recurrent TIA or stroke symptoms.  She is also had no further episodes of vertigo or dizziness.  She continues to live alone and is independent in most actives of daily living.  She just does not drive and daughter has to take of appointments.  She has good memory and manages own medications and affairs.  She has no new complaints.  She had recent lab work done by primary care physician on 03/15/2021 which showed hemoglobin A1c of 6.0 and elevated TSH of 19.02.  Her Synthroid dose has since been increased to 125 mcg.  LDL cholesterol was 84 mg percent on 04/13/2020.  She remains on aspirin which is tolerating well without bruising or bleeding.  Blood pressures well controlled and today it is 137/73.  She is tolerating Lipitor well without muscle aches and pains. ROS:   14 system review of systems is positive for   decreased hearing  only and all other systems negative  PMH:  Past Medical History:  Diagnosis Date   CVA (cerebral vascular accident) ([place])    Functional diarrhea 04/12/2020   Hypertension    Hypothyroidism (acquired)     Social History:  Social History   Socioeconomic History   Marital status: Single    Spouse name: Not on file   Number of children: Not on file   Years of education: Not on file   Highest education level: Not on file   Occupational History   Occupation: retired  Tobacco Use   Smoking status: Never   Smokeless tobacco: Never  Vaping Use   Vaping Use: Never used  Substance and Sexual Activity   Alcohol use: Yes    Comment: occasional wine   Drug use: Never   Sexual activity: Not on file  Other Topics Concern   Not on file  Social History Narrative   Lives alone   Right handed   Drinks 2-3 cups caffeine dailyt   Social Determinants of Health   Financial Resource Strain: Not on file  Food Insecurity: Not on file  Transportation Needs: Not on file  Physical Activity: Not on file  Stress: Not on file  Social Connections: Not on file  Intimate Partner Violence: Not on file    Medications:   Current Outpatient Medications on File Prior to Visit  Medication Sig Dispense Refill   amLODipine (NORVASC) 10 MG tablet Take 1 tablet (10 mg total) by mouth daily. 90 tablet 3   aspirin EC 81 MG EC tablet Take 1 tablet (81 mg total) by mouth daily. Swallow whole. 30 tablet 11   levothyroxine (SYNTHROID) 125 MCG tablet Take 1 tablet (125 mcg total) by mouth in the morning. On empty stomach, 72mins before food and other medications 90 tablet 1   potassium chloride (KLOR-CON) 10 MEQ tablet Take 1 tablet (10 mEq total) by mouth every morning. 90 tablet 1   valsartan-hydrochlorothiazide (DIOVAN-HCT) 320-12.5 MG tablet TAKE ONE (1) TABLET BY MOUTH EVERY DAY 90 tablet 1   atorvastatin (LIPITOR) 40 MG tablet Take 1 tablet (40 mg total) by mouth daily. 30 tablet 0   No current facility-administered medications on file prior to visit.    Allergies:  No Known Allergies  Physical Exam General: Frail pleasant elderly Caucasian lady, seated, in no evident distress Head: head normocephalic and atraumatic.  Neck: supple with no soft bilateral carotid   bruits Cardiovascular: regular rate and rhythm, no murmurs Musculoskeletal: no deformity Skin:  no rash/petichiae Vascular:  Normal pulses all  extremities Vitals:   03/23/21 1432  BP: 137/73  Morales: 83   Neurologic Exam Mental Status: Awake and fully alert. Oriented to place and time. Recent and remote memory intact. Attention span, concentration and fund of knowledge appropriate. Mood and affect appropriate.  Cranial Nerves: Fundoscopic exam reveals sharp disc margins. Pupils equal, briskly reactive to light. Extraocular movements full without nystagmus. Visual fields full to confrontation. Hearing diminished bilaterally. Facial sensation intact. Face, tongue, palate moves normally and symmetrically.  Motor: Normal bulk and tone. Normal strength in all tested extremity muscles. Sensory.: intact to touch ,pinprick .position and vibratory sensation.  Coordination: Rapid alternating movements normal in all extremities. Finger-to-nose and heel-to-shin performed accurately bilaterally. Gait and Station: Arises from chair without difficulty. Stance is normal. Gait demonstrates normal stride length and balance .  Unable to do tandem walking.  Reflexes: 1+ and symmetric. Toes downgoing.   NIHSS  0 Modified Rankin  0  ASSESSMENT: 87 year old pleasant Caucasian lady with transient positional dizziness likely due to benign paroxysmal positional vertigo which appears to have resolved.  Silent right parietal infarct in October 2021.  Vascular risk factors of hypertension hyperlipidemia and age  Doing well and stable from neurovascular standpoint   PLAN: I had a long d/w patient about remote TIAs , risk for recurrent stroke/TIAs, personally independently reviewed imaging studies and stroke evaluation results and answered questions.Continue aspirin 81 mg daily  for secondary stroke prevention and maintain strict control of hypertension with blood pressure goal below 130/90, diabetes with hemoglobin A1c goal below 6.5% and lipids with LDL cholesterol goal below 70 mg/dL. I also advised the patient to eat a healthy diet with plenty of whole  grains, cereals, fruits and vegetables, exercise regularly and maintain ideal body weight .check follow-up screening carotid ultrasound study.  Followup in the future with me in 1 year or call earlier if necessary. Greater than 50% of time during this  25 minute visit was spent on counseling,explanation of diagnosis, planning of further management, discussion with patient and family and coordination of care Antony Contras, MD Note: This document was prepared with digital dictation and possible smart phrase technology. Any transcriptional errors that result from this process are unintentional

## 2021-03-27 ENCOUNTER — Telehealth: Payer: Self-pay | Admitting: Neurology

## 2021-03-27 NOTE — Telephone Encounter (Signed)
[employer] medicare no auth require, sent Shannon Morales a message she will reach out to the patient to schedule.

## 2021-04-05 ENCOUNTER — Ambulatory Visit (HOSPITAL_COMMUNITY)
Admission: RE | Admit: 2021-04-05 | Discharge: 2021-04-05 | Disposition: A | Discharge status: Home / Self Care | Payer: Medicare Other | Source: Ambulatory Visit | Attending: Neurology | Admitting: Neurology

## 2021-04-05 ENCOUNTER — Other Ambulatory Visit: Payer: Self-pay

## 2021-04-05 DIAGNOSIS — Z8673 Personal history of transient ischemic attack (TIA), and cerebral infarction without residual deficits: Secondary | ICD-10-CM | POA: Insufficient documentation

## 2021-04-05 DIAGNOSIS — R0989 Other specified symptoms and signs involving the circulatory and respiratory systems: Secondary | ICD-10-CM | POA: Insufficient documentation

## 2021-04-05 NOTE — Progress Notes (Signed)
Carotid duplex has been completed.   Preliminary results in CV Proc.   Shannon Morales 04/05/2021 4:27 PM

## 2021-04-18 ENCOUNTER — Telehealth: Payer: Self-pay | Admitting: *Deleted

## 2021-04-18 NOTE — Telephone Encounter (Signed)
-----   Message from Garvin Fila, MD sent at 04/18/2021 12:57 PM EDT ----- Mitchell Heir inform the patient that carotid ultrasound study shows no significant narrowing on the right and mild 40 to 59% narrowing of the left carotid artery.  This can be managed with medical treatment and no surgery or stenting is necessary at this time ----- Message ----- From: Interface, Three One Seven Sent: 04/05/2021   4:27 PM EDT To: Garvin Fila, MD

## 2021-04-18 NOTE — Telephone Encounter (Signed)
I called the patient's dgt, Carter Kitten, on DPR. She verbalized understanding of the findings.

## 2021-05-19 ENCOUNTER — Telehealth: Payer: Self-pay

## 2021-05-19 NOTE — Telephone Encounter (Signed)
Pt had a lab appt on 05/23/21 for a lab visit for a thyroid recheck.  Pts daughter called and said pt does not want this appt. Per daughter, pt stated she has been checked out enough this year.

## 2021-05-23 ENCOUNTER — Other Ambulatory Visit: Payer: Medicare Other

## 2021-05-29 NOTE — Telephone Encounter (Signed)
Pt and daughter notified. Pt does not want a referral to endocrinology. Lab appointment rescheduled.

## 2021-05-31 ENCOUNTER — Other Ambulatory Visit: Payer: Self-pay

## 2021-05-31 ENCOUNTER — Other Ambulatory Visit (INDEPENDENT_AMBULATORY_CARE_PROVIDER_SITE_OTHER): Payer: Medicare Other

## 2021-05-31 DIAGNOSIS — E039 Hypothyroidism, unspecified: Secondary | ICD-10-CM

## 2021-06-01 LAB — THYROID PANEL WITH TSH
Free Thyroxine Index: 4.2 — ABNORMAL HIGH (ref 1.4–3.8)
T3 Uptake: 32 % (ref 22–35)
T4, Total: 13.1 ug/dL — ABNORMAL HIGH (ref 5.1–11.9)
TSH: 3.21 mIU/L (ref 0.40–4.50)

## 2021-06-01 MED ORDER — LEVOTHYROXINE SODIUM 125 MCG PO TABS: 125.0000 ug | ORAL_TABLET | Freq: Every morning | ORAL | 1 refills | Status: DC

## 2021-06-01 NOTE — Addendum Note (Signed)
Addended by: Wilfred Lacy L on: 06/01/2021 01:43 PM   Modules accepted: Orders

## 2021-09-12 ENCOUNTER — Encounter: Payer: Self-pay | Admitting: Nurse Practitioner

## 2021-09-12 ENCOUNTER — Ambulatory Visit (INDEPENDENT_AMBULATORY_CARE_PROVIDER_SITE_OTHER): Payer: Medicare Other | Admitting: Nurse Practitioner

## 2021-09-12 ENCOUNTER — Other Ambulatory Visit: Payer: Self-pay

## 2021-09-12 VITALS — BP 136/84 | HR 103 | Temp 96.7°F | Ht 60.0 in | Wt 148.4 lb

## 2021-09-12 DIAGNOSIS — I10 Essential (primary) hypertension: Secondary | ICD-10-CM

## 2021-09-12 DIAGNOSIS — E039 Hypothyroidism, unspecified: Secondary | ICD-10-CM

## 2021-09-12 DIAGNOSIS — N1832 Chronic kidney disease, stage 3b: Secondary | ICD-10-CM

## 2021-09-12 DIAGNOSIS — R739 Hyperglycemia, unspecified: Secondary | ICD-10-CM | POA: Diagnosis not present

## 2021-09-12 DIAGNOSIS — I700 Atherosclerosis of aorta: Secondary | ICD-10-CM

## 2021-09-12 MED ORDER — ATORVASTATIN CALCIUM 40 MG PO TABS: 40.0000 mg | ORAL_TABLET | Freq: Every day | ORAL | 3 refills | Status: DC

## 2021-09-12 MED ORDER — VALSARTAN-HYDROCHLOROTHIAZIDE 320-12.5 MG PO TABS: ORAL_TABLET | ORAL | 1 refills | Status: DC

## 2021-09-12 MED ORDER — AMLODIPINE BESYLATE 10 MG PO TABS: 10.0000 mg | ORAL_TABLET | Freq: Every day | ORAL | 3 refills | Status: DC

## 2021-09-12 NOTE — Assessment & Plan Note (Addendum)
Repeat TSH and T4: Low Tsh and high T4: decrease levothyroxine to 152mg daily.new rx sent. ?Repeat labs in 399month?

## 2021-09-12 NOTE — Progress Notes (Signed)
? ?Subjective:  ?Patient ID: Shannon Morales, female    DOB: Oct 01, 1924  Age: 88 y.o. MRN: [id number] ? ?CC: Follow-up (6 month f/u on HTN and thyroid. /Pt states home BP has been ranging 130s-140s/60s-70s.) ? ?HPI ? ?Aortic atherosclerosis ([place]) ?Repeat lipid panel and hgbA1c ?hgbA1c at 6.1%: prediabetes.  ?Abnormal lipid panel: elevated triglyceride at 222 ? BP at goal ?Maintain atorvastatin dose ?Advised to Maintain low carb/low sugar diet. ?Repeat labs in 7month (fasting). ? ?Essential hypertension ?Home BP readings: 120s-130s/60s-70s ?current use of amlodipine, valsartan,and HCTZ. ?BP Readings from Last 3 Encounters:  ?09/12/21 136/84  ?03/23/21 137/73  ?03/15/21 112/60  ? ?Maintain med doses and DASH diet ?Repeat BMP today: persistent decline in renal function: changed valsartan/hctz to valsartan only ?D/c potassium since no longer taking hctz ?Repeat BMP in 381month? ? ?Hypothyroidism (acquired) ?Repeat TSH and T4: Low Tsh and high T4: decrease levothyroxine to 11235mdaily.new rx sent. ?Repeat labs in 64mo42month?Hyperglycemia ? repeat hgbA1c at 6.1%: prediabetes. Maintain low carb/low sugar diet. ? ?Wt Readings from Last 3 Encounters:  ?09/12/21 148 lb 6.4 oz (67.3 kg)  ?03/23/21 149 lb (67.6 kg)  ?03/15/21 149 lb (67.6 kg)  ?  ?BP Readings from Last 3 Encounters:  ?09/12/21 136/84  ?03/23/21 137/73  ?03/15/21 112/60  ?  ?Reviewed past Medical, Social and Family history today. ? ?Outpatient Medications Prior to Visit  ?Medication Sig Dispense Refill  ? aspirin EC 81 MG EC tablet Take 1 tablet (81 mg total) by mouth daily. Swallow whole. 30 tablet 11  ? levothyroxine (SYNTHROID) 125 MCG tablet Take 1 tablet (125 mcg total) by mouth in the morning. On empty stomach, 30mi93mefore food and other medications 90 tablet 1  ? potassium chloride (KLOR-CON) 10 MEQ tablet Take 1 tablet (10 mEq total) by mouth every morning. 90 tablet 1  ? valsartan-hydrochlorothiazide (DIOVAN-HCT) 320-12.5 MG tablet TAKE ONE (1)  TABLET BY MOUTH EVERY DAY 90 tablet 1  ? amLODipine (NORVASC) 10 MG tablet Take 1 tablet (10 mg total) by mouth daily. 90 tablet 3  ? atorvastatin (LIPITOR) 40 MG tablet Take 1 tablet (40 mg total) by mouth daily. 30 tablet 0  ? ?No facility-administered medications prior to visit.  ? ? ?ROS ?See HPI ? ?Objective:  ?BP 136/84 (BP Location: Left Arm, Patient Position: Sitting, Cuff Size: Large)   Pulse (!) 103   Temp (!) 96.7 ?F (35.9 ?C) (Temporal)   Ht 5' (1.524 m)   Wt 148 lb 6.4 oz (67.3 kg)   SpO2 98%   BMI 28.98 kg/m?  ? ?Physical Exam ?Vitals reviewed.  ?Cardiovascular:  ?   Rate and Rhythm: Normal rate and regular rhythm.  ?   Heart sounds: Murmur heard.  ?  Gallop present.  ?Pulmonary:  ?   Effort: Pulmonary effort is normal.  ?   Breath sounds: Normal breath sounds.  ?Musculoskeletal:  ?   Right lower leg: No edema.  ?   Left lower leg: No edema.  ?Neurological:  ?   Mental Status: She is alert and oriented to person, place, and time.  ? ?Assessment & Plan:  ?This visit occurred during the SARS-CoV-2 public health emergency.  Safety protocols were in place, including screening questions prior to the visit, additional usage of staff PPE, and extensive cleaning of exam room while observing appropriate contact time as indicated for disinfecting solutions.  ? ?Meztli was seen today for follow-up. ? ?Diagnoses and all orders for this visit: ? ?Hypothyroidism (acquired) ?-  TSH ?-     T4, free ?-     levothyroxine (SYNTHROID) 112 MCG tablet; Take 1 tablet (112 mcg total) by mouth daily before breakfast. On empty stomach, 58mns before food and other medications ?-     TSH; Future ?-     T4, free; Future ? ?Essential hypertension ?-     Basic metabolic panel ?-     amLODipine (NORVASC) 10 MG tablet; Take 1 tablet (10 mg total) by mouth daily. ?-     Discontinue: valsartan-hydrochlorothiazide (DIOVAN-HCT) 320-12.5 MG tablet; TAKE ONE (1) TABLET BY MOUTH EVERY DAY ?-     valsartan (DIOVAN) 320 MG tablet;  Take 1 tablet (320 mg total) by mouth daily. ?-     Basic metabolic panel; Future ? ?[place] ?-     Lipid panel ?-     atorvastatin (LIPITOR) 40 MG tablet; Take 1 tablet (40 mg total) by mouth daily. ? ?Hyperglycemia ?-     Hemoglobin A1c ? ?Stage 3b chronic kidney disease ([place] ?-     Basic metabolic panel; Future ? ?Other orders ?-     LDL cholesterol, direct ? ? ? ?Problem List Items Addressed This Visit   ? ?  ? Cardiovascular and Mediastinum  ? [place] atherosclerosis ([place]  ?  Repeat lipid panel and hgbA1c ?hgbA1c at 6.1%: prediabetes.  ?Abnormal lipid panel: elevated triglyceride at 222 ? BP at goal ?Maintain atorvastatin dose ?Advised to Maintain low carb/low sugar diet. ?Repeat labs in 619month(fasting). ?  ?  ? Relevant Medications  ? amLODipine (NORVASC) 10 MG tablet  ? atorvastatin (LIPITOR) 40 MG tablet  ? valsartan (DIOVAN) 320 MG tablet  ? Other Relevant Orders  ? Lipid panel (Completed)  ? Essential hypertension  ?  Home BP readings: 120s-130s/60s-70s ?current use of amlodipine, valsartan,and HCTZ. ?BP Readings from Last 3 Encounters:  ?09/12/21 136/84  ?03/23/21 137/73  ?03/15/21 112/60  ? ?Maintain med doses and DASH diet ?Repeat BMP today: persistent decline in renal function: changed valsartan/hctz to valsartan only ?D/c potassium since no longer taking hctz ?Repeat BMP in 13m54month ?  ?  ? Relevant Medications  ? amLODipine (NORVASC) 10 MG tablet  ? atorvastatin (LIPITOR) 40 MG tablet  ? valsartan (DIOVAN) 320 MG tablet  ? Other Relevant Orders  ? Basic metabolic panel (Completed)  ? Basic metabolic panel  ?  ? Endocrine  ? Hypothyroidism (acquired) - Primary  ?  Repeat TSH and T4: Low Tsh and high T4: decrease levothyroxine to 112m57maily.new rx sent. ?Repeat labs in 13mon16month?  ? Relevant Medications  ? levothyroxine (SYNTHROID) 112 MCG tablet  ? Other Relevant Orders  ? TSH (Completed)  ? T4, free (Completed)  ? TSH  ? T4, free  ?  ? Other  ? Hyperglycemia  ?   repeat  hgbA1c at 6.1%: prediabetes. Maintain low carb/low sugar diet. ?  ?  ? Relevant Orders  ? Hemoglobin A1c (Completed)  ? ?Other Visit Diagnoses   ? ? Stage 3b chronic kidney disease (HCC) [place]  ? Relevant Orders  ? Basic metabolic panel  ? ?  ?  ?Follow-up: Return in about 6 months (around 03/15/2022) for HTN, hypothyroidism . ? ?CharlWilfred Lacy?

## 2021-09-12 NOTE — Assessment & Plan Note (Addendum)
Home BP readings: 120s-130s/60s-70s ?current use of amlodipine, valsartan,and HCTZ. ?BP Readings from Last 3 Encounters:  ?09/12/21 136/84  ?03/23/21 137/73  ?03/15/21 112/60  ? ?Maintain med doses and DASH diet ?Repeat BMP today: persistent decline in renal function: changed valsartan/hctz to valsartan only ?D/c potassium since no longer taking hctz ?Repeat BMP in 73month ? ?

## 2021-09-12 NOTE — Assessment & Plan Note (Addendum)
Repeat lipid panel and hgbA1c ?hgbA1c at 6.1%: prediabetes.  ?Abnormal lipid panel: elevated triglyceride at 222 ? BP at goal ?Maintain atorvastatin dose ?Advised to Maintain low carb/low sugar diet. ?Repeat labs in 56month (fasting). ?

## 2021-09-12 NOTE — Patient Instructions (Signed)
Maintain current medications. ? ?Go to lab for blood draw ? ?Bring copy of living will and health care power of attorney. ? ?Continue BP check 2-3x/week in AM. ? ?Try not to drink anything within 1-2hours of bedtime to prevent nocturia (urinating in the middle of the night). ? ?

## 2021-09-13 DIAGNOSIS — R739 Hyperglycemia, unspecified: Secondary | ICD-10-CM | POA: Insufficient documentation

## 2021-09-13 LAB — LIPID PANEL
Cholesterol: 178 mg/dL (ref 0–200)
HDL: 63.9 mg/dL (ref >39.00)
NonHDL: 113.71
Total CHOL/HDL Ratio: 3
Triglycerides: 222 mg/dL — ABNORMAL HIGH (ref 0.0–149.0)
VLDL: 44.4 mg/dL — ABNORMAL HIGH (ref 0.0–40.0)

## 2021-09-13 LAB — BASIC METABOLIC PANEL
BUN: 23 mg/dL (ref 6–23)
CO2: 31 mEq/L (ref 19–32)
Calcium: 9.8 mg/dL (ref 8.4–10.5)
Chloride: 102 mEq/L (ref 96–112)
Creatinine, Ser: 1.2 mg/dL (ref 0.40–1.20)
GFR: 38.64 mL/min — ABNORMAL LOW (ref >60.00)
Glucose, Bld: 123 mg/dL — ABNORMAL HIGH (ref 70–99)
Potassium: 4.6 mEq/L (ref 3.5–5.1)
Sodium: 140 mEq/L (ref 135–145)

## 2021-09-13 LAB — LDL CHOLESTEROL, DIRECT: Direct LDL: 92 mg/dL

## 2021-09-13 LAB — TSH: TSH: 0.02 u[IU]/mL — ABNORMAL LOW (ref 0.35–5.50)

## 2021-09-13 LAB — T4, FREE: Free T4: 2.15 ng/dL — ABNORMAL HIGH (ref 0.60–1.60)

## 2021-09-13 LAB — HEMOGLOBIN A1C: Hgb A1c MFr Bld: 6.1 % (ref 4.6–6.5)

## 2021-09-13 MED ORDER — VALSARTAN 320 MG PO TABS: 320.0000 mg | ORAL_TABLET | Freq: Every day | ORAL | 3 refills | Status: DC

## 2021-09-13 MED ORDER — LEVOTHYROXINE SODIUM 112 MCG PO TABS: 112.0000 ug | ORAL_TABLET | Freq: Every day | ORAL | 1 refills | Status: DC

## 2021-09-13 NOTE — Assessment & Plan Note (Signed)
repeat hgbA1c at 6.1%: prediabetes. Maintain low carb/low sugar diet. ?

## 2021-11-20 ENCOUNTER — Telehealth: Payer: Self-pay | Admitting: Nurse Practitioner

## 2021-11-20 NOTE — Telephone Encounter (Signed)
Left message for patient's daughter,Cathy, to call back and schedule Medicare Annual Wellness Visit (AWV).   Please offer to do virtually or by telephone.  Left office number and my jabber [phone].  Due for AWVI  Please schedule at anytime with Nurse Health Advisor.

## 2021-12-22 ENCOUNTER — Telehealth: Payer: Self-pay | Admitting: Nurse Practitioner

## 2022-01-17 ENCOUNTER — Ambulatory Visit (INDEPENDENT_AMBULATORY_CARE_PROVIDER_SITE_OTHER): Payer: Medicare Other

## 2022-01-17 DIAGNOSIS — Z0000 Encounter for general adult medical examination without abnormal findings: Secondary | ICD-10-CM

## 2022-01-17 NOTE — Patient Instructions (Signed)
Shannon Morales , Thank you for taking time to come for your Medicare Wellness Visit. I appreciate your ongoing commitment to your health goals. Please review the following plan we discussed and let me know if I can assist you in the future.   Screening recommendations/referrals: Colonoscopy: no longer required  Mammogram: no longer required  Bone Density: declined  Recommended yearly ophthalmology/optometry visit for glaucoma screening and checkup Recommended yearly dental visit for hygiene and checkup  Vaccinations: Influenza vaccine: completed  Pneumococcal vaccine: completed  Tdap vaccine: due  Shingles vaccine: will consider     Advanced directives: yes   Conditions/risks identified: none   Next appointment: none    Preventive Care 70 Years and Older, Female Preventive care refers to lifestyle choices and visits with your health care provider that can promote health and wellness. What does preventive care include? A yearly physical exam. This is also called an annual well check. Dental exams once or twice a year. Routine eye exams. Ask your health care provider how often you should have your eyes checked. Personal lifestyle choices, including: Daily care of your teeth and gums. Regular physical activity. Eating a healthy diet. Avoiding tobacco and drug use. Limiting alcohol use. Practicing safe sex. Taking low-dose aspirin every day. Taking vitamin and mineral supplements as recommended by your health care provider. What happens during an annual well check? The services and screenings done by your health care provider during your annual well check will depend on your age, overall health, lifestyle risk factors, and family history of disease. Counseling  Your health care provider may ask you questions about your: Alcohol use. Tobacco use. Drug use. Emotional well-being. Home and relationship well-being. Sexual activity. Eating habits. History of falls. Memory and  ability to understand (cognition). Work and work Statistician. Reproductive health. Screening  You may have the following tests or measurements: Height, weight, and BMI. Blood pressure. Lipid and cholesterol levels. These may be checked every 5 years, or more frequently if you are over 22 years old. Skin check. Lung cancer screening. You may have this screening every year starting at age 59 if you have a 30-pack-year history of smoking and currently smoke or have quit within the past 15 years. Fecal occult blood test (FOBT) of the stool. You may have this test every year starting at age 34. Flexible sigmoidoscopy or colonoscopy. You may have a sigmoidoscopy every 5 years or a colonoscopy every 10 years starting at age 76. Hepatitis C blood test. Hepatitis B blood test. Sexually transmitted disease (STD) testing. Diabetes screening. This is done by checking your blood sugar (glucose) after you have not eaten for a while (fasting). You may have this done every 1-3 years. Bone density scan. This is done to screen for osteoporosis. You may have this done starting at age 45. Mammogram. This may be done every 1-2 years. Talk to your health care provider about how often you should have regular mammograms. Talk with your health care provider about your test results, treatment options, and if necessary, the need for more tests. Vaccines  Your health care provider may recommend certain vaccines, such as: Influenza vaccine. This is recommended every year. Tetanus, diphtheria, and acellular pertussis (Tdap, Td) vaccine. You may need a Td booster every 10 years. Zoster vaccine. You may need this after age 31. Pneumococcal 13-valent conjugate (PCV13) vaccine. One dose is recommended after age 83. Pneumococcal polysaccharide (PPSV23) vaccine. One dose is recommended after age 9. Talk to your health care provider about which  screenings and vaccines you need and how often you need them. This information is  not intended to replace advice given to you by your health care provider. Make sure you discuss any questions you have with your health care provider. Document Released: 07/15/2015 Document Revised: 03/07/2016 Document Reviewed: 04/19/2015 Elsevier Interactive Patient Education  2017 [place] Prevention in the Home Falls can cause injuries. They can happen to people of all ages. There are many things you can do to make your home safe and to help prevent falls. What can I do on the outside of my home? Regularly fix the edges of walkways and driveways and fix any cracks. Remove anything that might make you trip as you walk through a door, such as a raised step or threshold. Trim any bushes or trees on the path to your home. Use bright outdoor lighting. Clear any walking paths of anything that might make someone trip, such as rocks or tools. Regularly check to see if handrails are loose or broken. Make sure that both sides of any steps have handrails. Any raised decks and porches should have guardrails on the edges. Have any leaves, snow, or ice cleared regularly. Use sand or salt on walking paths during winter. Clean up any spills in your garage right away. This includes oil or grease spills. What can I do in the bathroom? Use night lights. Install grab bars by the toilet and in the tub and shower. Do not use towel bars as grab bars. Use non-skid mats or decals in the tub or shower. If you need to sit down in the shower, use a plastic, non-slip stool. Keep the floor dry. Clean up any water that spills on the floor as soon as it happens. Remove soap buildup in the tub or shower regularly. Attach bath mats securely with double-sided non-slip rug tape. Do not have throw rugs and other things on the floor that can make you trip. What can I do in the bedroom? Use night lights. Make sure that you have a light by your bed that is easy to reach. Do not use any sheets or blankets that  are too big for your bed. They should not hang down onto the floor. Have a firm chair that has side arms. You can use this for support while you get dressed. Do not have throw rugs and other things on the floor that can make you trip. What can I do in the kitchen? Clean up any spills right away. Avoid walking on wet floors. Keep items that you use a lot in easy-to-reach places. If you need to reach something above you, use a strong step stool that has a grab bar. Keep electrical cords out of the way. Do not use floor polish or wax that makes floors slippery. If you must use wax, use non-skid floor wax. Do not have throw rugs and other things on the floor that can make you trip. What can I do with my stairs? Do not leave any items on the stairs. Make sure that there are handrails on both sides of the stairs and use them. Fix handrails that are broken or loose. Make sure that handrails are as long as the stairways. Check any carpeting to make sure that it is firmly attached to the stairs. Fix any carpet that is loose or worn. Avoid having throw rugs at the top or bottom of the stairs. If you do have throw rugs, attach them to the floor with carpet  tape. Make sure that you have a light switch at the top of the stairs and the bottom of the stairs. If you do not have them, ask someone to add them for you. What else can I do to help prevent falls? Wear shoes that: Do not have high heels. Have rubber bottoms. Are comfortable and fit you well. Are closed at the toe. Do not wear sandals. If you use a stepladder: Make sure that it is fully opened. Do not climb a closed stepladder. Make sure that both sides of the stepladder are locked into place. Ask someone to hold it for you, if possible. Clearly mark and make sure that you can see: Any grab bars or handrails. First and last steps. Where the edge of each step is. Use tools that help you move around (mobility aids) if they are needed. These  include: Canes. Walkers. Scooters. Crutches. Turn on the lights when you go into a dark area. Replace any light bulbs as soon as they burn out. Set up your furniture so you have a clear path. Avoid moving your furniture around. If any of your floors are uneven, fix them. If there are any pets around you, be aware of where they are. Review your medicines with your doctor. Some medicines can make you feel dizzy. This can increase your chance of falling. Ask your doctor what other things that you can do to help prevent falls. This information is not intended to replace advice given to you by your health care provider. Make sure you discuss any questions you have with your health care provider. Document Released: 04/14/2009 Document Revised: 11/24/2015 Document Reviewed: 07/23/2014 Elsevier Interactive Patient Education  2017 [employer].

## 2022-01-17 NOTE — Progress Notes (Signed)
Subjective:   Shannon Morales is a 88 y.o. female who presents for Medicare Annual (Subsequent) preventive examination.   I connected with Westley Foots  today by telephone and verified that I am speaking with the correct person using two identifiers. Location patient: home Location provider: work Persons participating in the virtual visit: patient, provider.   I discussed the limitations, risks, security and privacy concerns of performing an evaluation and management service by telephone and the availability of in person appointments. I also discussed with the patient that there may be a patient responsible charge related to this service. The patient expressed understanding and verbally consented to this telephonic visit.    Interactive audio and video telecommunications were attempted between this provider and patient, however failed, due to patient having technical difficulties OR patient did not have access to video capability.  We continued and completed visit with audio only.    Review of Systems     Cardiac Risk Factors include: advanced age (>64mn, >>71women);hypertension     Objective:    Today's Vitals   There is no height or weight on file to calculate BMI.     01/17/2022    1:01 PM 03/15/2021    3:02 PM 04/12/2020    8:53 AM  Advanced Directives  Does Patient Have a Medical Advance Directive? Yes Yes No  Type of [place]amed[place] will [place] will   Copy of [place] Chart? No - copy requested No - copy requested   Would patient like information on creating a medical advance directive?   No - Patient declined    Current Medications (verified) Outpatient Encounter Medications as of 01/17/2022  Medication Sig   amLODipine (NORVASC) 10 MG tablet Take 1 tablet (10 mg total) by mouth daily.   aspirin EC 81 MG EC tablet Take 1 tablet (81 mg total) by mouth daily. Swallow whole.    atorvastatin (LIPITOR) 40 MG tablet Take 1 tablet (40 mg total) by mouth daily.   levothyroxine (SYNTHROID) 112 MCG tablet Take 1 tablet (112 mcg total) by mouth daily before breakfast. On empty stomach, 342ms before food and other medications   valsartan (DIOVAN) 320 MG tablet Take 1 tablet (320 mg total) by mouth daily.   No facility-administered encounter medications on file as of 01/17/2022.    Allergies (verified) Patient has no known allergies.   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   Hypertension    Hypothyroidism (acquired)    Past Surgical History:  Procedure Laterality Date   HYSTERECTOMY ABDOMINAL WITH SALPINGO-OOPHORECTOMY     [facility]  Problem Relation Age of Onset   Throat cancer Mother    CAD Father    CAD Sister    Cancer Sister    Stroke Neg Hx    Social History   Socioeconomic History   Marital status: Single    Spouse name: Not on file   Number of children: Not on file   Years of education: Not on file   Highest education level: Not on file  Occupational History   Occupation: retired  Tobacco Use   Smoking status: Never   Smokeless tobacco: Never  Vaping Use   Vaping Use: Never used  Substance and Sexual Activity   Alcohol use: Yes    Comment: occasional wine   Drug use: Never  Sexual activity: Not on file  Other Topics Concern   Not on file  Social History Narrative   Lives alone   Right handed   Drinks 2-3 cups caffeine dailyt   Social Determinants of Health   Financial Resource Strain: Low Risk  (01/17/2022)   Overall Financial Resource Strain (CARDIA)    Difficulty of Paying Living Expenses: Not hard at all  Food Insecurity: No Food Insecurity (01/17/2022)   Hunger Vital Sign    Worried About Running Out of Food in the Last Year: Never true    Ran Out of Food in the Last Year: Never true  Transportation Needs: No  Transportation Needs (01/17/2022)   PRAPARE - Hydrologist (Medical): No    Lack of Transportation (Non-Medical): No  Physical Activity: Insufficiently Active (01/17/2022)   Exercise Vital Sign    Days of Exercise per Week: 3 days    Minutes of Exercise per Session: 30 min  Stress: No Stress Concern Present (01/17/2022)   [place]    Feeling of Stress : Not at all  Social Connections: Moderately Isolated (01/17/2022)   Social Connection and Isolation Panel [NHANES]    Frequency of Communication with Friends and Family: Three times a week    Frequency of Social Gatherings with Friends and Family: Three times a week    Attends Religious Services: More than 4 times per year    Active Member of Clubs or Organizations: No    Attends Archivist Meetings: Never    Marital Status: Widowed    Tobacco Counseling Counseling given: Not Answered   Clinical Intake:  Pre-visit preparation completed: Yes  Pain : No/denies pain     Nutritional Risks: None Diabetes: No  How often do you need to have someone help you when you read instructions, pamphlets, or other written materials from your doctor or pharmacy?: 1 - Never What is the last grade level you completed in school?: High School  Diabetic?no   Interpreter Needed?: No  Information entered by :: L.Shenandoah Vandergriff,LPN   Activities of Daily Living    01/17/2022    1:04 PM  In your present state of health, do you have any difficulty performing the following activities:  Hearing? 0  Vision? 0  Difficulty concentrating or making decisions? 0  Walking or climbing stairs? 0  Dressing or bathing? 0  Doing errands, shopping? 0  Preparing Food and eating ? N  Using the Toilet? N  In the past six months, have you accidently leaked urine? N  Do you have problems with loss of bowel control? N  Managing your Medications? N  Managing your  Finances? N  Housekeeping or managing your Housekeeping? N    Patient Care Team: Nche, Charlene Brooke, NP as PCP - General (Internal Medicine)  Indicate any recent Medical Services you may have received from other than [facility] providers in the past year (date may be approximate).     Assessment:   This is a routine wellness examination for [place].  Hearing/Vision screen Vision Screening - Comments:: Annual eye exams wears glasses   Dietary issues and exercise activities discussed: Current Exercise Habits: Home exercise routine, Type of exercise: walking, Time (Minutes): 30, Frequency (Times/Week): 3, Weekly Exercise (Minutes/Week): 90, Intensity: Mild, Exercise limited by: None identified   Goals Addressed   None    Depression Screen    01/17/2022    1:00 PM 01/17/2022   12:58 PM  09/12/2021    1:42 PM 03/15/2021    2:09 PM 01/27/2020   11:09 AM  PHQ 2/9 Scores  PHQ - 2 Score 0 0 0 0 0    Fall Risk    01/17/2022    1:02 PM 09/12/2021    1:37 PM 04/27/2020   12:42 PM 01/27/2020   11:09 AM  [place] in the past year? 0 0 1 0  Number falls in past yr: 0 0 1   Injury with Fall? 0 0 1   Risk for fall due to : No Fall Risks No Fall Risks History of fall(s);Impaired balance/gait   Follow up Falls evaluation completed;Education provided Falls evaluation completed Falls evaluation completed;Falls prevention discussed;Education provided;Follow up appointment     FALL RISK PREVENTION PERTAINING TO THE HOME:  Any stairs in or around the home? Yes  If so, are there any without handrails? No  Home free of loose throw rugs in walkways, pet beds, electrical cords, etc? Yes  Adequate lighting in your home to reduce risk of falls? Yes   ASSISTIVE DEVICES UTILIZED TO PREVENT FALLS:  Life alert? No  Use of a cane, walker or w/c? No  Grab bars in the bathroom? Yes  Shower chair or bench in shower? Yes  Elevated toilet seat or a handicapped toilet? No     Cognitive Function:     Normal cognitive status assessed by telephone conversation by this Nurse Health Advisor. No abnormalities found.      Immunizations Immunization History  Administered Date(s) Administered   Fluad Quad(high Dose 65+) 03/15/2021   Influenza Split 07/03/2011   Influenza, High Dose Seasonal PF 04/23/2019   Influenza-Unspecified 07/03/2011   PFIZER(Purple Top)SARS-COV-2 Vaccination 09/11/2019, 10/15/2019   Pneumococcal Conjugate-13 07/29/2015, 07/29/2015   Pneumococcal Polysaccharide-23 07/02/2008, 07/02/2008    TDAP status: Due, Education has been provided regarding the importance of this vaccine. Advised may receive this vaccine at local pharmacy or Health Dept. Aware to provide a copy of the vaccination record if obtained from local pharmacy or Health Dept. Verbalized acceptance and understanding.  Flu Vaccine status: Up to date  Pneumococcal vaccine status: Up to date  Covid-19 vaccine status: Completed vaccines  Qualifies for Shingles Vaccine? Yes   Zostavax completed No   Shingrix Completed?: No.    Education has been provided regarding the importance of this vaccine. Patient has been advised to call insurance company to determine out of pocket expense if they have not yet received this vaccine. Advised may also receive vaccine at local pharmacy or Health Dept. Verbalized acceptance and understanding.  Screening Tests Health Maintenance  Topic Date Due   Zoster Vaccines- Shingrix (1 of 2) Never done   COVID-19 Vaccine (3 - Pfizer series) 12/10/2019   DEXA SCAN  03/15/2022 (Originally [birth year]1991)   TETANUS/TDAP  03/15/2022 (Originally 10/22/1943)   INFLUENZA VACCINE  01/30/2022   Pneumonia Vaccine 42+ Years old  Completed   HPV VACCINES  Aged Out    Health Maintenance  Health Maintenance Due  Topic Date Due   Zoster Vaccines- Shingrix (1 of 2) Never done   COVID-19 Vaccine (3 - Pfizer series) 12/10/2019    Colorectal cancer screening: No longer required.   Mammogram  status: No longer required due to age.  Bone Density status: Ordered patient declined . Pt provided with contact info and advised to call to schedule appt.  Lung Cancer Screening: (Low Dose CT Chest recommended if Age 42-80 years, 30 pack-year currently smoking OR have  quit w/in 15years.) does not qualify.   Lung Cancer Screening Referral: n/a  Additional Screening:  Hepatitis C Screening: does not qualify;   Vision Screening: Recommended annual ophthalmology exams for early detection of glaucoma and other disorders of the eye. Is the patient up to date with their annual eye exam?  Yes  Who is the provider or what is the name of the office in which the patient attends annual eye exams? Dr,Digby  If pt is not established with a provider, would they like to be referred to a provider to establish care? No .   Dental Screening: Recommended annual dental exams for proper oral hygiene  Community Resource Referral / Chronic Care Management: CRR required this visit?  No   CCM required this visit?  No      Plan:     I have personally reviewed and noted the following in the patient's chart:   Medical and social history Use of alcohol, tobacco or illicit drugs  Current medications and supplements including opioid prescriptions.  Functional ability and status Nutritional status Physical activity Advanced directives List of other physicians Hospitalizations, surgeries, and ER visits in previous 12 months Vitals Screenings to include cognitive, depression, and falls Referrals and appointments  In addition, I have reviewed and discussed with patient certain preventive protocols, quality metrics, and best practice recommendations. A written personalized care plan for preventive services as well as general preventive health recommendations were provided to patient.     Randel Pigg, LPN   [id number]   Nurse Notes: none

## 2022-03-13 ENCOUNTER — Encounter: Payer: Self-pay | Admitting: Nurse Practitioner

## 2022-03-13 ENCOUNTER — Ambulatory Visit (INDEPENDENT_AMBULATORY_CARE_PROVIDER_SITE_OTHER): Payer: Medicare Other | Admitting: Nurse Practitioner

## 2022-03-13 VITALS — BP 150/80 | HR 104 | Temp 97.4°F | Ht 60.0 in | Wt 144.6 lb

## 2022-03-13 DIAGNOSIS — E039 Hypothyroidism, unspecified: Secondary | ICD-10-CM

## 2022-03-13 DIAGNOSIS — I10 Essential (primary) hypertension: Secondary | ICD-10-CM

## 2022-03-13 DIAGNOSIS — H6122 Impacted cerumen, left ear: Secondary | ICD-10-CM

## 2022-03-13 DIAGNOSIS — R739 Hyperglycemia, unspecified: Secondary | ICD-10-CM | POA: Diagnosis not present

## 2022-03-13 DIAGNOSIS — U071 COVID-19: Secondary | ICD-10-CM | POA: Diagnosis not present

## 2022-03-13 DIAGNOSIS — I700 Atherosclerosis of aorta: Secondary | ICD-10-CM

## 2022-03-13 LAB — TSH: TSH: 0.65 u[IU]/mL (ref 0.35–5.50)

## 2022-03-13 LAB — BASIC METABOLIC PANEL
BUN: 22 mg/dL (ref 6–23)
CO2: 28 mEq/L (ref 19–32)
Calcium: 9.8 mg/dL (ref 8.4–10.5)
Chloride: 99 mEq/L (ref 96–112)
Creatinine, Ser: 1.2 mg/dL (ref 0.40–1.20)
GFR: 38.51 mL/min — ABNORMAL LOW (ref >60.00)
Glucose, Bld: 105 mg/dL — ABNORMAL HIGH (ref 70–99)
Potassium: 3.9 mEq/L (ref 3.5–5.1)
Sodium: 137 mEq/L (ref 135–145)

## 2022-03-13 LAB — T4, FREE: Free T4: 1.4 ng/dL (ref 0.60–1.60)

## 2022-03-13 LAB — HEMOGLOBIN A1C: Hgb A1c MFr Bld: 6.3 % (ref 4.6–6.5)

## 2022-03-13 MED ORDER — FLUTICASONE PROPIONATE 50 MCG/ACT NA SUSP: 2.0000 | Freq: Every day | NASAL | 0 refills | Status: DC

## 2022-03-13 MED ORDER — MOLNUPIRAVIR EUA 200MG CAPSULE: 4.0000 | ORAL_CAPSULE | Freq: Two times a day (BID) | ORAL | 0 refills | Status: AC

## 2022-03-13 NOTE — Assessment & Plan Note (Signed)
Repeat TSH and t4 

## 2022-03-13 NOTE — Progress Notes (Unsigned)
Established Patient Visit  Patient: Shannon Morales   DOB: [birth year]   88 y.o. Female  MRN: [id number] Visit Date: 03/13/2022  Subjective:    Chief Complaint  Patient presents with   Acute Visit    C/o of hearing loss recently with some left ear pain Needs referral  Flu shot given today    Accompanied by son  URI  This is a new problem. The current episode started yesterday. The problem has been unchanged. There has been no fever. Associated symptoms include congestion, coughing, ear pain, a plugged ear sensation, rhinorrhea and sinus pain. Pertinent negatives include no abdominal pain, chest pain, diarrhea, dysuria, headaches, joint pain, joint swelling, nausea, neck pain, rash, sneezing, sore throat, swollen glands, vomiting or wheezing. She has tried NSAIDs for the symptoms. The treatment provided mild relief.   Essential hypertension Elevated BP today due to acute URI symptoms. She denies use of any OTC decongestant. Reports she is complaint with amlodipine and valsartan. BP Readings from Last 3 Encounters:  03/13/22 (!) 150/80  09/12/21 136/84  03/23/21 137/73   Advised to monitor BP at home,, maintain DASh diet F/up in 66month Hypothyroidism (acquired) Repeat TSH and t4  Hyperglycemia Repeat hgbA1c  COVID-19 Start molnuparivir for COVID infection due to decline in renal function. Use flonase and claritin for sinus congestion Ok to use mucinex or mucinex DM or robitussin for cough Stop atorvastatin while taking molnuparivir Avoid any cold and sinus medication with a decongestant due to elevated BP. Maintain adequate oral hydration. Provided ED precautions.  Reviewed medical, surgical, and social history today  Medications: Outpatient Medications Prior to Visit  Medication Sig   amLODipine (NORVASC) 10 MG tablet Take 1 tablet (10 mg total) by mouth daily.   aspirin EC 81 MG EC tablet Take 1 tablet (81 mg total) by mouth daily. Swallow whole.    atorvastatin (LIPITOR) 40 MG tablet Take 1 tablet (40 mg total) by mouth daily.   levothyroxine (SYNTHROID) 112 MCG tablet Take 1 tablet (112 mcg total) by mouth daily before breakfast. On empty stomach, 366ms before food and other medications   valsartan (DIOVAN) 320 MG tablet Take 1 tablet (320 mg total) by mouth daily.   No facility-administered medications prior to visit.   Reviewed past medical and social history.   ROS per HPI above  Last metabolic panel Lab Results  Component Value Date   GLUCOSE 123 (H) 09/12/2021   NA 140 09/12/2021   K 4.6 09/12/2021   CL 102 09/12/2021   CO2 31 09/12/2021   BUN 23 09/12/2021   CREATININE 1.20 09/12/2021   GFRNONAA 57 (L) 04/12/2020   CALCIUM 9.8 09/12/2021   PROT 7.4 09/07/2020   ALBUMIN 4.3 09/07/2020   BILITOT 0.6 09/07/2020   ALKPHOS 47 09/07/2020   AST 20 09/07/2020   ALT 13 09/07/2020   ANIONGAP 12 04/12/2020      Objective:  BP (!) 150/80   Pulse (!) 104   Temp (!) 97.4 F (36.3 C) (Temporal)   Ht 5' (1.524 m)   Wt 144 lb 9.6 oz (65.6 kg)   SpO2 96%   BMI 28.24 kg/m      Physical Exam Vitals reviewed.  HENT:     Right Ear: Ear canal and external ear normal. Decreased hearing noted. A middle ear effusion is present. There is no impacted cerumen. No mastoid tenderness. Tympanic membrane is not scarred, perforated  or erythematous.     Left Ear: Decreased hearing noted. There is impacted cerumen. No mastoid tenderness.     Ears:     Comments: Post cerumen removal from left ear: no erythema of TM, effusion, TM intact, no bulging Cardiovascular:     Rate and Rhythm: Normal rate and regular rhythm.     Pulses: Normal pulses.     Heart sounds: Normal heart sounds.  Pulmonary:     Effort: Pulmonary effort is normal.     Breath sounds: Normal breath sounds.  Neurological:     Mental Status: She is alert and oriented to person, place, and time.    Procedure Note :    Procedure :  Ear irrigation(left)  Indication:   hearing loss secondary to Cerumen impaction (left)  Risks, including pain, dizziness, eardrum perforation, bleeding, infection and others as well as benefits were explained to the patient in detail. Verbal consent was obtained and the patient agreed to proceed.   We used "The Elephant Ear Irrigation Device" filled with lukewarm water for irrigation. A large amount wax was recovered. Procedure did not require manual wax removal with an ear loop.  Tolerated well. Complications: None.  Postprocedure instructions :  Call if problems.   No results found for any visits on 03/13/22.    Assessment & Plan:    Problem List Items Addressed This Visit       Cardiovascular and Mediastinum   Essential hypertension    Elevated BP today due to acute URI symptoms. She denies use of any OTC decongestant. Reports she is complaint with amlodipine and valsartan. BP Readings from Last 3 Encounters:  03/13/22 (!) 150/80  09/12/21 136/84  03/23/21 137/73   Advised to monitor BP at home,, maintain DASh diet F/up in 60month     Relevant Orders   Basic metabolic panel     Endocrine   Hypothyroidism (acquired)    Repeat TSH and t4      Relevant Orders   TSH   T4, free     Other   COVID-19 - Primary    Start molnuparivir for COVID infection due to decline in renal function. Use flonase and claritin for sinus congestion Ok to use mucinex or mucinex DM or robitussin for cough Stop atorvastatin while taking molnuparivir Avoid any cold and sinus medication with a decongestant due to elevated BP. Maintain adequate oral hydration. Provided ED precautions.      Relevant Medications   molnupiravir EUA (LAGEVRIO) 200 mg CAPS capsule   fluticasone (FLONASE) 50 MCG/ACT nasal spray   Other Relevant Orders   POC COVID-19   Hyperglycemia    Repeat hgbA1c      Relevant Orders   Hemoglobin A1c   Other Visit Diagnoses     Hearing loss due to cerumen impaction, left          Return in about 4  weeks (around 04/10/2022) for HTN.     CWilfred Lacy NP

## 2022-03-13 NOTE — Assessment & Plan Note (Signed)
Repeat hgbA1c 

## 2022-03-13 NOTE — Assessment & Plan Note (Signed)
Start molnuparivir for COVID infection due to decline in renal function. Use flonase and claritin for sinus congestion Ok to use mucinex or mucinex DM or robitussin for cough Stop atorvastatin while taking molnuparivir Avoid any cold and sinus medication with a decongestant due to elevated BP. Maintain adequate oral hydration. Provided ED precautions.

## 2022-03-13 NOTE — Assessment & Plan Note (Signed)
Elevated BP today due to acute URI symptoms. She denies use of any OTC decongestant. Reports she is complaint with amlodipine and valsartan. BP Readings from Last 3 Encounters:  03/13/22 (!) 150/80  09/12/21 136/84  03/23/21 137/73   Advised to monitor BP at home,, maintain DASh diet F/up in 48month

## 2022-03-13 NOTE — Patient Instructions (Addendum)
Go to lab.  Start molnuparivir for COVID infection Use flonase and claritin for sinus congestion Ok to use mucinex or mucinex DM or robitussin for cough Stop atorvastatin while taking molnuparivir  F/up in 13monthfor re eval of BP. Avoid any cold and sinus medication with a decongestant due to elevated BP.  Maintain adequate oral hydration.

## 2022-03-14 LAB — POC COVID19 BINAXNOW: SARS Coronavirus 2 Ag: POSITIVE — AB

## 2022-03-14 MED ORDER — AMLODIPINE BESYLATE 10 MG PO TABS: 10.0000 mg | ORAL_TABLET | Freq: Every day | ORAL | 3 refills | Status: DC

## 2022-03-14 MED ORDER — LEVOTHYROXINE SODIUM 112 MCG PO TABS: 112.0000 ug | ORAL_TABLET | Freq: Every day | ORAL | 1 refills | Status: DC

## 2022-03-14 MED ORDER — VALSARTAN 320 MG PO TABS: 320.0000 mg | ORAL_TABLET | Freq: Every day | ORAL | 3 refills | Status: DC

## 2022-03-17 ENCOUNTER — Other Ambulatory Visit: Payer: Self-pay | Admitting: Nurse Practitioner

## 2022-03-19 NOTE — Telephone Encounter (Signed)
Chart supports Rx Last OV: 03/2022 Next OV: 04/2022

## 2022-04-02 ENCOUNTER — Ambulatory Visit: Payer: Medicare Other | Admitting: Neurology

## 2022-04-02 ENCOUNTER — Encounter: Payer: Self-pay | Admitting: Neurology

## 2022-04-02 ENCOUNTER — Telehealth: Payer: Self-pay | Admitting: Nurse Practitioner

## 2022-04-02 VITALS — BP 152/64 | HR 86 | Ht 60.0 in | Wt 144.0 lb

## 2022-04-02 DIAGNOSIS — H8110 Benign paroxysmal vertigo, unspecified ear: Secondary | ICD-10-CM

## 2022-04-02 DIAGNOSIS — H9193 Unspecified hearing loss, bilateral: Secondary | ICD-10-CM

## 2022-04-02 DIAGNOSIS — Z8673 Personal history of transient ischemic attack (TIA), and cerebral infarction without residual deficits: Secondary | ICD-10-CM

## 2022-04-02 NOTE — Telephone Encounter (Signed)
Caller Name: [name] Call back phone #: [phone]  Reason for Call: Mom received a referral for ENT, she wanted to know if they could get it switched to Dr Melida Quitter with [facility]

## 2022-04-02 NOTE — Patient Instructions (Signed)
I had a long discussion with the patient and her daughter regarding her new complaints of decreased hearing and likely earwax and recommend referral to ENT for ear cleaning hearing loss assessment.  Continue aspirin for stroke prevention and maintain aggressive risk factor modification with strict control of hypertension with blood pressure goal below 140/90 , lipids with LDL cholesterol goal below 70 mg percent and diabetes with hemoglobin A1c goal below 6.5%.  She will return for follow-up in the future only as needed and no scheduled appointment was made

## 2022-04-02 NOTE — Progress Notes (Signed)
[facility] [address] [place] [address] [phone]       OFFICE FOLLOW-UP NOTE  Ms. Maywood Date of Birth:  04-Feb-1925 Medical Record Number:  [id number]   HPI: Ms. Plumb is a pleasant 88 year old Caucasian lady seen today for initial office follow-up visit following hospital consultation for stroke in October 2021.  She is accompanied by her daughter Juliann Pulse.  History is obtained from them, review of electronic medical records and I personally reviewed pertinent imaging films in PACS.  She has past medical history of hypertension, hypothyroidism and stroke.  She presented on 04/12/2020 with sudden onset of dizziness when she tried to sit up in bed the night prior to admission.  She was able to make her room slowly to the bathroom but while urinating fell backwards.  She described initially vertigo and had some nausea but denied vomiting.  She denied any blurred vision, double vision extremity weakness or numbness.  Her blood pressure was found to be elevated at 193/88 on initial admission subsequently came down.  CT scan of the head on admission was unremarkable and subsequently MRI scan showed a small right parietal subacute punctate infarct which was felt to be clinically silent.  Her dizziness was felt to be related to peripheral vestibular dysfunction from benign positional vertigo.  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  Echocardiogram showed normal ejection fraction without cardiac source of embolism.  LDL cholesterol is 84 mg percent.  Hemoglobin A1c is 5.6.  She was seen by physical therapy who recommended vestibular rehab which patient did in it was beneficial and she is no longer having any dizziness of positional vertigo.  She was discharged on aspirin Plavix which she took for 3 weeks and tolerated well and is  currently on aspirin alone and tolerating it well without bruising or bleeding.  She states her blood pressure is usually under good control the today it is elevated in the office at 178/79.  She had over 30-day heart monitor which was negative for paroxysmal A. fib.  She had home physical occupational and speech therapy which is now finished.  She is tolerating Lipitor well without side effects.  She has no new complaints today.  She is pretty independent in all activities of daily living. Update 03/23/2021 ; She returns for follow-up after last visit 9 months ago.  She is accompanied by her daughter.  Patient continues to do well.  She has had no recurrent TIA or stroke symptoms.  She is also had no further episodes of vertigo or dizziness.  She continues to live alone and is independent in most actives of daily living.  She just does not drive and daughter has to take of appointments.  She has good memory and manages own medications and affairs.  She has no new complaints.  She had recent lab work done by primary care physician on 03/15/2021 which showed hemoglobin A1c of 6.0 and elevated TSH of 19.02.  Her Synthroid dose has since been increased to 125 mcg.  LDL cholesterol was 84 mg percent on 04/13/2020.  She remains on aspirin which is tolerating well without bruising or bleeding.  Blood pressures well controlled and today it is 137/73.  She is tolerating Lipitor well without muscle aches and pains. Update 04/02/2022 ; she returns for follow-up after last visit a year ago.  She  is accompanied by her daughter.  Patient states she is doing well.  She has had no recurrent TIA or stroke symptoms.  He also denies episodes of any further vertigo or dizziness.  He continues to live alone and is independent in most activities of daily living.  Her only complaint today is that she lost her hearing a few weeks ago now partially improved.  She was seen by urgent care physician who thought she had a lot of wax.  She  however has not been referred to ENT yet.  She denies any tinnitus or ear pain.  Remains on aspirin which is tolerating well without bruising or bleeding.  Her blood pressure is usually under good control.  Remains on Lipitor which is tolerating well without muscle aches and pains.  She had lipid profile checked last month by primary care physician and it was satisfactory.  He has no other new complaints today.  Follow-up screening carotid ultrasound on 04/05/2021 showed 40 to 59% left ICA and 1-39% right ICA stenosis. ROS:   14 system review of systems is positive for   decreased hearing only and all other systems negative  PMH:  Past Medical History:  Diagnosis Date   CVA (cerebral vascular accident) ([place])    Functional diarrhea 04/12/2020   Hypertension    Hypothyroidism (acquired)     Social History:  Social History   Socioeconomic History   Marital status: Single    Spouse name: Not on file   Number of children: Not on file   Years of education: Not on file   Highest education level: Not on file  Occupational History   Occupation: retired  Tobacco Use   Smoking status: Never   Smokeless tobacco: Never  Vaping Use   Vaping Use: Never used  Substance and Sexual Activity   Alcohol use: Yes    Comment: occasional wine   Drug use: Never   Sexual activity: Not on file  Other Topics Concern   Not on file  Social History Narrative   Lives alone   Right handed   Drinks 2-3 cups caffeine dailyt   Social Determinants of Health   Financial Resource Strain: Low Risk  (01/17/2022)   Overall Financial Resource Strain (CARDIA)    Difficulty of Paying Living Expenses: Not hard at all  Food Insecurity: No Food Insecurity (01/17/2022)   Hunger Vital Sign    Worried About Running Out of Food in the Last Year: Never true    Ran Out of Food in the Last Year: Never true  Transportation Needs: No Transportation Needs (01/17/2022)   PRAPARE - Hydrologist  (Medical): No    Lack of Transportation (Non-Medical): No  Physical Activity: Insufficiently Active (01/17/2022)   Exercise Vital Sign    Days of Exercise per Week: 3 days    Minutes of Exercise per Session: 30 min  Stress: No Stress Concern Present (01/17/2022)   [place]    Feeling of Stress : Not at all  Social Connections: Moderately Isolated (01/17/2022)   Social Connection and Isolation Panel [NHANES]    Frequency of Communication with Friends and Family: Three times a week    Frequency of Social Gatherings with Friends and Family: Three times a week    Attends Religious Services: More than 4 times per year    Active Member of Clubs or Organizations: No    Attends Archivist Meetings: Never  Marital Status: Widowed  Intimate Partner Violence: Not At Risk (01/17/2022)   Humiliation, Afraid, Rape, and Kick questionnaire    Fear of Current or Ex-Partner: No    Emotionally Abused: No    Physically Abused: No    Sexually Abused: No    Medications:   Current Outpatient Medications on File Prior to Visit  Medication Sig Dispense Refill   amLODipine (NORVASC) 10 MG tablet Take 1 tablet (10 mg total) by mouth daily. 90 tablet 3   aspirin EC 81 MG EC tablet Take 1 tablet (81 mg total) by mouth daily. Swallow whole. 30 tablet 11   atorvastatin (LIPITOR) 40 MG tablet Take 1 tablet (40 mg total) by mouth daily. 90 tablet 3   levothyroxine (SYNTHROID) 112 MCG tablet Take 1 tablet (112 mcg total) by mouth daily before breakfast. On empty stomach, 75mns before food and other medications 90 tablet 1   potassium chloride (KLOR-CON) 10 MEQ tablet TAKE 1 TABLET BY MOUTH EVERY MORNING 90 tablet 1   valsartan (DIOVAN) 320 MG tablet Take 1 tablet (320 mg total) by mouth daily. 90 tablet 3   No current facility-administered medications on file prior to visit.    Allergies:  No Known Allergies  Physical Exam General:  Frail pleasant elderly Caucasian lady, seated, in no evident distress Head: head normocephalic and atraumatic.  Neck: supple with no soft bilateral carotid   bruits Cardiovascular: regular rate and rhythm, no murmurs Musculoskeletal: no deformity Skin:  no rash/petichiae Vascular:  Normal pulses all extremities Vitals:   04/02/22 1350  BP: (!) 152/64  Pulse: 86   Neurologic Exam Mental Status: Awake and fully alert. Oriented to place and time. Recent and remote memory intact. Attention span, concentration and fund of knowledge appropriate. Mood and affect appropriate.  Cranial Nerves: Fundoscopic exam not done. Pupils equal, briskly reactive to light. Extraocular movements full without nystagmus. Visual fields full to confrontation. Hearing diminished bilaterally left more than right. Facial sensation intact. Face, tongue, palate moves normally and symmetrically.  Motor: Normal bulk and tone. Normal strength in all tested extremity muscles. Sensory.: intact to touch ,pinprick .position and vibratory sensation.  Coordination: Rapid alternating movements normal in all extremities. Finger-to-nose and heel-to-shin performed accurately bilaterally. Gait and Station: Arises from chair without difficulty. Stance is normal. Gait demonstrates normal stride length and balance .  Unable to do tandem walking.  Reflexes: 1+ and symmetric. Toes downgoing.      ASSESSMENT: 88year old pleasant Caucasian lady with transient positional dizziness likely due to benign paroxysmal positional vertigo which appears to have resolved.  Silent right parietal infarct in October 2021.  Vascular risk factors of hypertension hyperlipidemia and age.  He is to be stable from neurovascular standpoint     PLAN: I had a long discussion with the patient and her daughter regarding her new complaints of decreased hearing and likely earwax and recommend referral to ENT for ear cleaning hearing loss assessment.  Continue  aspirin for stroke prevention and maintain aggressive risk factor modification with strict control of hypertension with blood pressure goal below 140/90 , lipids with LDL cholesterol goal below 70 mg percent and diabetes with hemoglobin A1c goal below 6.5%.  She will return for follow-up in the future only as needed and no scheduled appointment was made. I also advised the patient to eat a healthy diet with plenty of whole grains, cereals, fruits and vegetables, exercise regularly and maintain ideal body weight .check follow-up s yearly creening carotid ultrasound study.  Referred  to ENT for evaluation for hearing loss possibly.  Followup in the future with me in future only as needed. Greater than 50% of time during this  35 minute visit was spent on counseling,explanation of diagnosis, planning of further management, discussion with patient and family and coordination of care Antony Contras, MD Note: This document was prepared with digital dictation and possible smart phrase technology. Any transcriptional errors that result from this process are unintentional

## 2022-04-03 ENCOUNTER — Telehealth: Payer: Self-pay | Admitting: Neurology

## 2022-04-03 NOTE — Telephone Encounter (Signed)
Left VM on daughters phone and advised

## 2022-04-03 NOTE — Telephone Encounter (Signed)
Referral for ENT sent to Dr. Leta Baptist. Phone: [phone], Fax: [phone]

## 2022-04-17 ENCOUNTER — Ambulatory Visit: Payer: Medicare Other | Admitting: Nurse Practitioner

## 2022-08-30 IMAGING — CT CT HEAD W/O CM
4 series · 16 of 47 positions shown, 18 images · non-contrast
Comparison: None.

CLINICAL DATA: Head trauma.  Near syncope

EXAM:
CT HEAD WITHOUT CONTRAST
TECHNIQUE: Contiguous axial images were obtained from the base of the skull
through the vertex without intravenous contrast.

[Series 3: head without · axial · non-contrast · 0.42mm/px · z∈[-124,-4]mm · 7 of 33 slices shown, 9 images]
[im 5/33  brain]
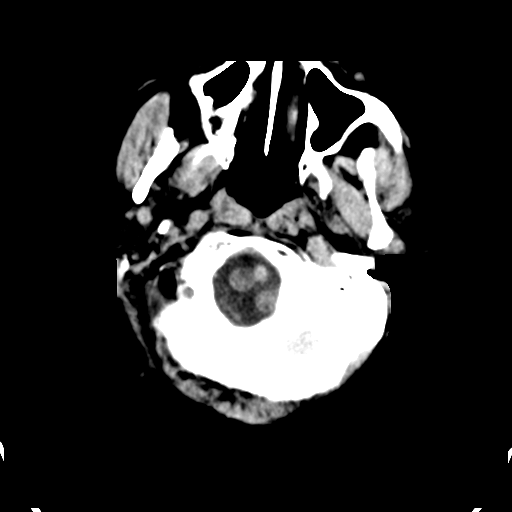
[im 5/33  bone]
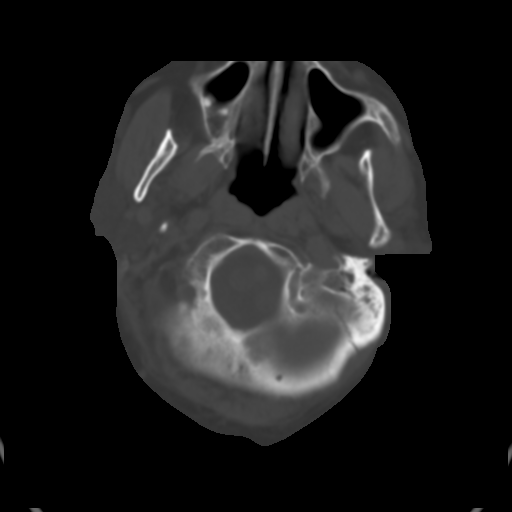
[im 9/33  brain]
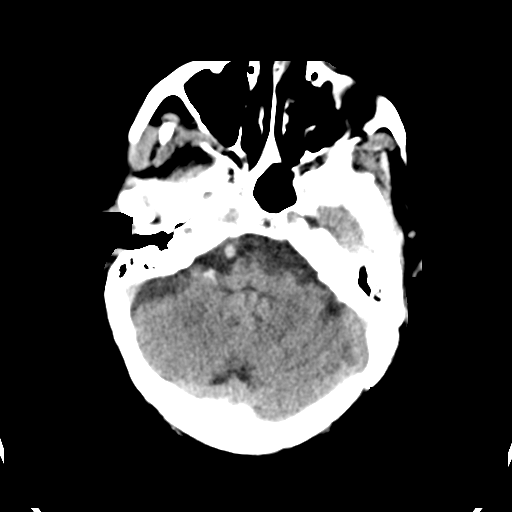
[im 13/33  brain]
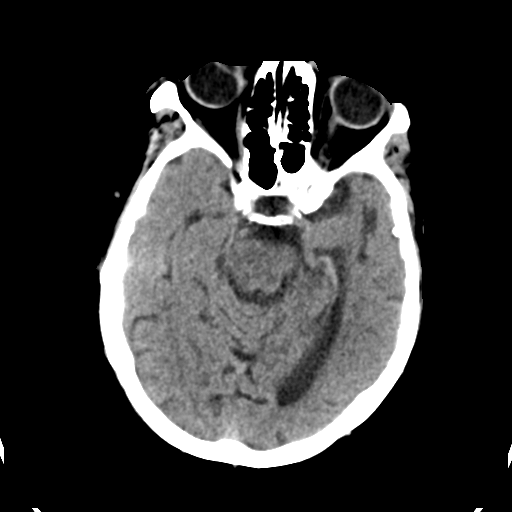
[im 17/33  brain]
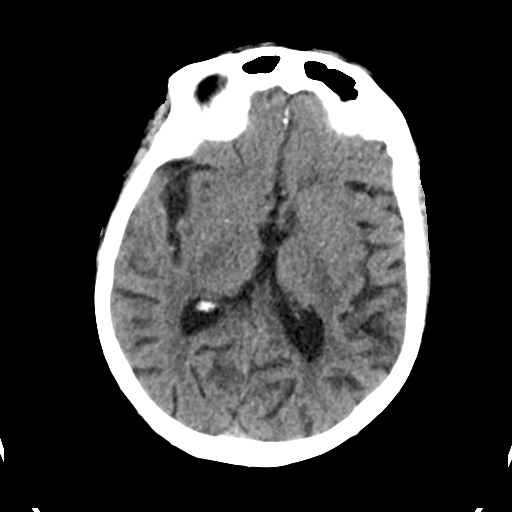
[im 21/33  brain]
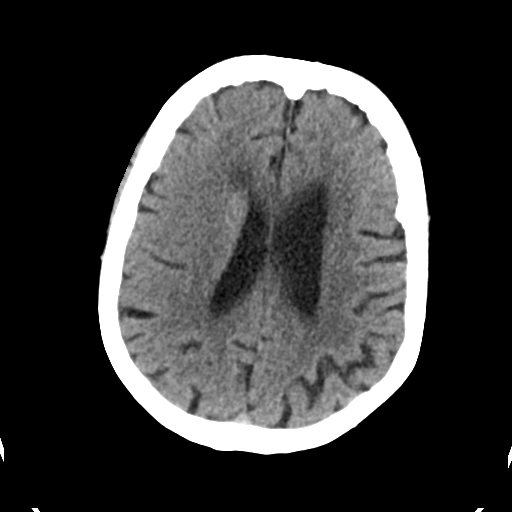
[im 21/33  bone]
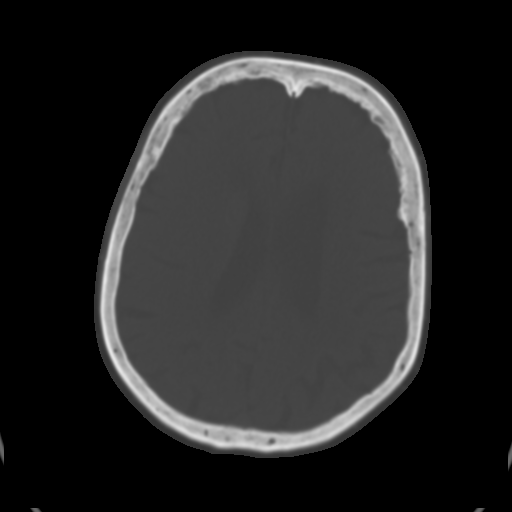
[im 25/33  brain]
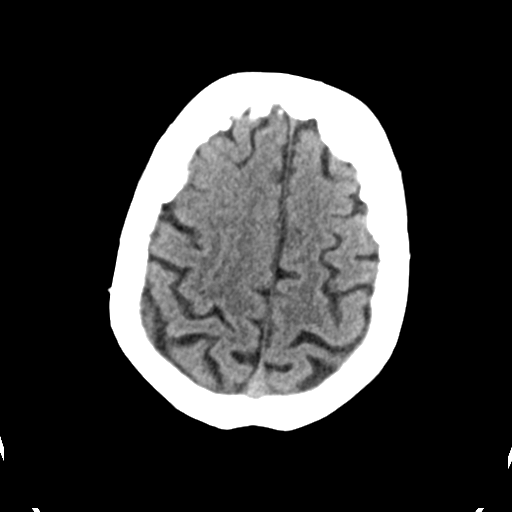
[im 29/33  brain]
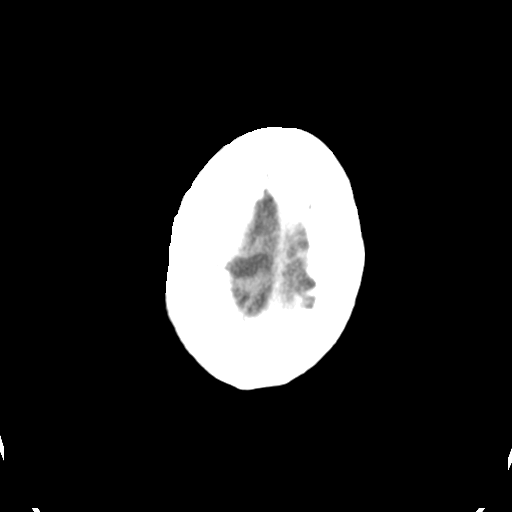

[Series 4: head bone · axial · 0.42mm/px · z∈[-128,-96]mm · 3 of 82 slices shown]
[im 9/82  bone]
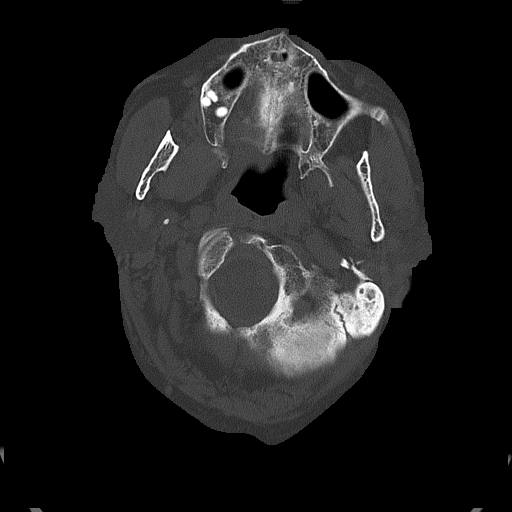
[im 17/82  bone]
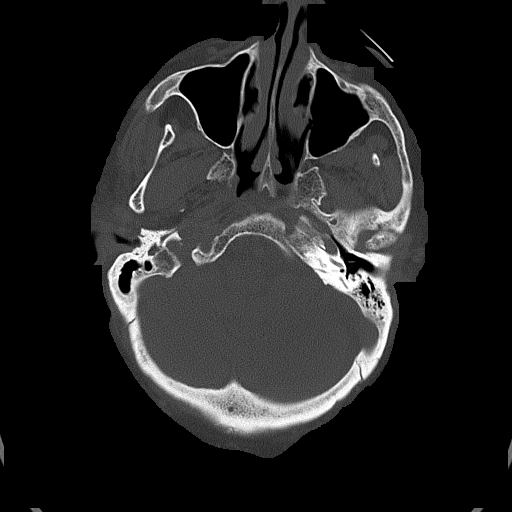
[im 25/82  bone]
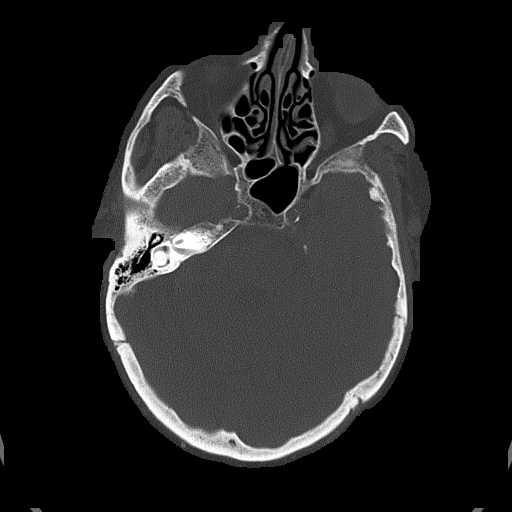

[Series 5: head without cor · coronal · non-contrast · 0.32mm/px · 3 of 67 slices shown]
[im 23/67  brain]
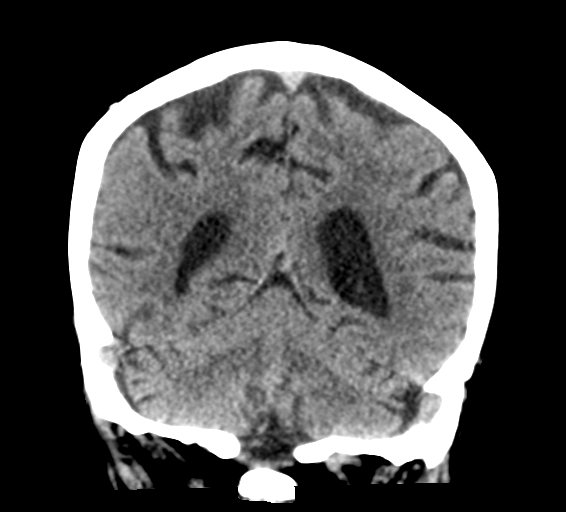
[im 30/67  brain]
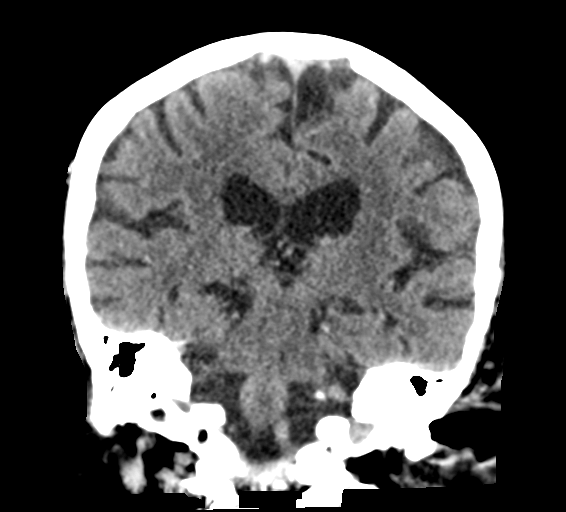
[im 37/67  brain]
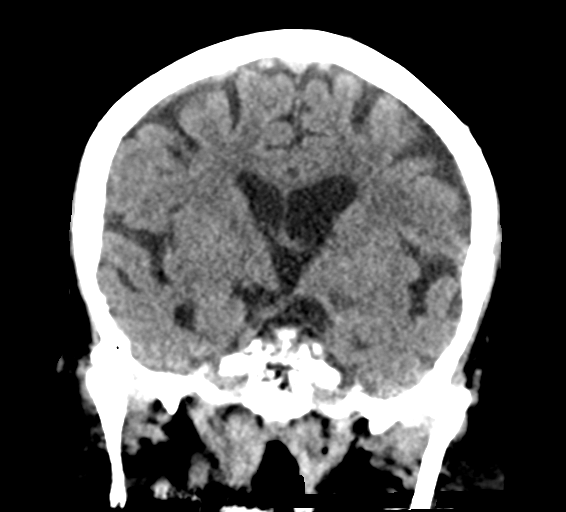

[Series 6: head without sag · sagittal · non-contrast · 0.32mm/px · 3 of 66 slices shown]
[im 22/66  brain]
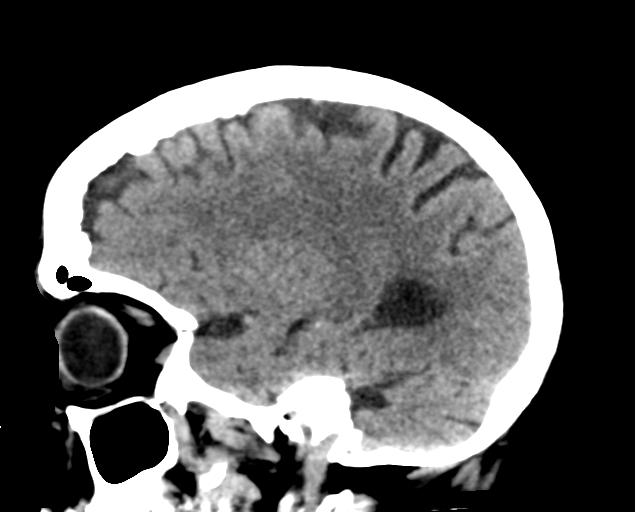
[im 33/66  brain]
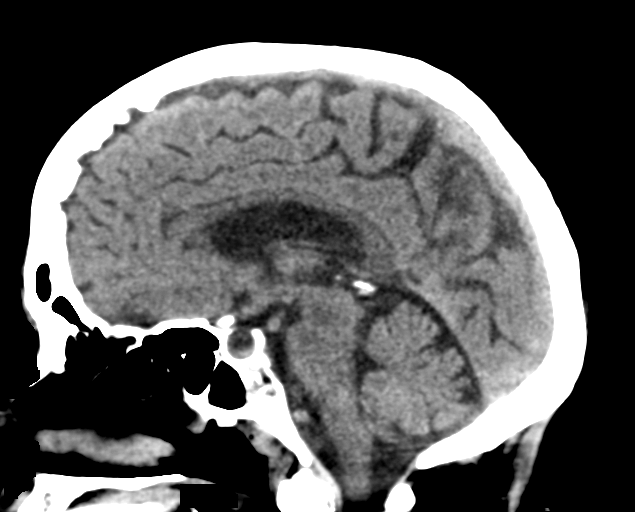
[im 44/66  brain]
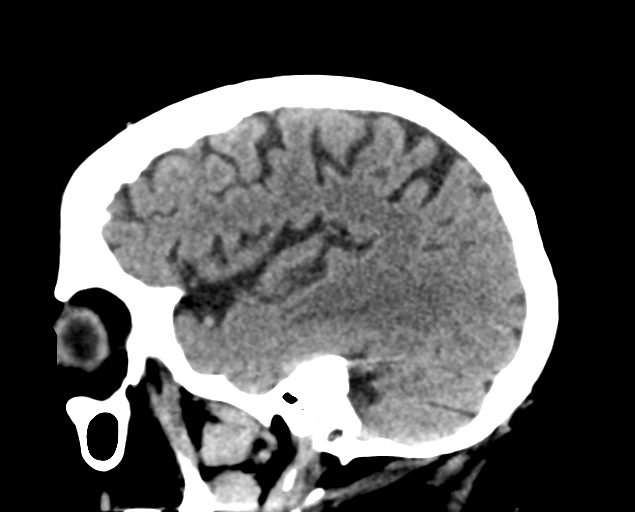

[16 of 47 positions shown; findings below may reference images not displayed]

FINDINGS: Brain: No evidence of acute infarction, hemorrhage, hydrocephalus,
extra-axial collection or mass lesion/mass effect. Scattered
low-density changes within the periventricular and subcortical white
matter compatible with chronic microvascular ischemic change. Mild
diffuse cerebral volume loss.

Vascular: Atherosclerotic calcifications involving the large vessels
of the skull base. No unexpected hyperdense vessel.

Skull: Normal. Negative for fracture or focal lesion.

Sinuses/Orbits: No acute finding.

Other: None.
IMPRESSION: 1. No acute intracranial findings.
2. Chronic microvascular ischemic change and cerebral volume loss.

## 2022-09-24 ENCOUNTER — Other Ambulatory Visit: Payer: Self-pay | Admitting: Nurse Practitioner

## 2022-10-06 ENCOUNTER — Other Ambulatory Visit: Payer: Self-pay | Admitting: Nurse Practitioner

## 2022-10-06 DIAGNOSIS — I700 Atherosclerosis of aorta: Secondary | ICD-10-CM

## 2022-10-25 ENCOUNTER — Encounter: Payer: Self-pay | Admitting: Nurse Practitioner

## 2022-10-25 ENCOUNTER — Ambulatory Visit (HOSPITAL_BASED_OUTPATIENT_CLINIC_OR_DEPARTMENT_OTHER)
Admission: RE | Admit: 2022-10-25 | Discharge: 2022-10-25 | Disposition: A | Discharge status: Home / Self Care | Payer: Medicare Other | Source: Ambulatory Visit | Attending: Nurse Practitioner | Admitting: Nurse Practitioner

## 2022-10-25 ENCOUNTER — Other Ambulatory Visit: Payer: Self-pay | Admitting: Nurse Practitioner

## 2022-10-25 ENCOUNTER — Ambulatory Visit (INDEPENDENT_AMBULATORY_CARE_PROVIDER_SITE_OTHER): Payer: Medicare Other | Admitting: Nurse Practitioner

## 2022-10-25 VITALS — BP 102/76 | HR 90 | Temp 98.5°F | Resp 14 | Ht 60.0 in | Wt 144.6 lb

## 2022-10-25 DIAGNOSIS — N1832 Chronic kidney disease, stage 3b: Secondary | ICD-10-CM

## 2022-10-25 DIAGNOSIS — R739 Hyperglycemia, unspecified: Secondary | ICD-10-CM

## 2022-10-25 DIAGNOSIS — E039 Hypothyroidism, unspecified: Secondary | ICD-10-CM

## 2022-10-25 DIAGNOSIS — M5442 Lumbago with sciatica, left side: Secondary | ICD-10-CM | POA: Diagnosis not present

## 2022-10-25 DIAGNOSIS — R413 Other amnesia: Secondary | ICD-10-CM

## 2022-10-25 DIAGNOSIS — I700 Atherosclerosis of aorta: Secondary | ICD-10-CM | POA: Diagnosis not present

## 2022-10-25 DIAGNOSIS — I10 Essential (primary) hypertension: Secondary | ICD-10-CM

## 2022-10-25 LAB — LIPID PANEL
Cholesterol: 146 mg/dL (ref 0–200)
HDL: 78.3 mg/dL (ref >39.00)
LDL Cholesterol: 51 mg/dL (ref 0–99)
NonHDL: 67.26
Total CHOL/HDL Ratio: 2
Triglycerides: 79 mg/dL (ref 0.0–149.0)
VLDL: 15.8 mg/dL (ref 0.0–40.0)

## 2022-10-25 LAB — COMPREHENSIVE METABOLIC PANEL
ALT: 16 U/L (ref 0–35)
AST: 22 U/L (ref 0–37)
Albumin: 4.1 g/dL (ref 3.5–5.2)
Alkaline Phosphatase: 54 U/L (ref 39–117)
BUN: 20 mg/dL (ref 6–23)
CO2: 24 mEq/L (ref 19–32)
Calcium: 9.2 mg/dL (ref 8.4–10.5)
Chloride: 102 mEq/L (ref 96–112)
Creatinine, Ser: 1.07 mg/dL (ref 0.40–1.20)
GFR: 44 mL/min — ABNORMAL LOW (ref >60.00)
Glucose, Bld: 115 mg/dL — ABNORMAL HIGH (ref 70–99)
Potassium: 4.2 mEq/L (ref 3.5–5.1)
Sodium: 138 mEq/L (ref 135–145)
Total Bilirubin: 0.7 mg/dL (ref 0.2–1.2)
Total Protein: 7.2 g/dL (ref 6.0–8.3)

## 2022-10-25 LAB — T4, FREE: Free T4: 1.24 ng/dL (ref 0.60–1.60)

## 2022-10-25 LAB — TSH: TSH: 0.56 u[IU]/mL (ref 0.35–5.50)

## 2022-10-25 LAB — HEMOGLOBIN A1C: Hgb A1c MFr Bld: 6.1 % (ref 4.6–6.5)

## 2022-10-25 MED ORDER — TRAMADOL-ACETAMINOPHEN 37.5-325 MG PO TABS: 1.0000 | ORAL_TABLET | Freq: Two times a day (BID) | ORAL | 0 refills | Status: AC | PRN

## 2022-10-25 NOTE — Assessment & Plan Note (Signed)
CTA neck 04/2020: Negative for large vessel occlusion.Positive for Severe stenosis of the Right PCA P2 segment.Calcified carotid atherosclerosis with only mild stenosis. Dominant left vertebral artery with only mild origin stenosis.Aortic Atherosclerosis (ICD10-I70.0). MRI brain 2021: Mild right parietal DWI hyperintensity concerning for subacute Infarct. Mild cerebral atrophy and chronic microvascular ischemic changes.  Home with son. She agreed to stop driving. She is independent with ADLs. Denies any help with taking current medications. Entered CCM referral for nurse manager and pharmacist

## 2022-10-25 NOTE — Assessment & Plan Note (Signed)
Repeat CMP Advised to avoid all OTC NSAIDs

## 2022-10-25 NOTE — Assessment & Plan Note (Signed)
Repeat lipid panel and hgbA1c hgbA1c at 6.3%: prediabetes.   BP at goal Maintain atorvastatin dose Advised to Maintain low carb/low sugar diet.

## 2022-10-25 NOTE — Progress Notes (Signed)
Established Patient Visit  Patient: Shannon Morales   DOB: 08/31/1924   89 y.o. Female  MRN: [id number] Visit Date: 10/26/2022  Subjective:    Chief Complaint  Patient presents with   Medication Refill   Pain    Pt would like something for pain.  Her legs and lower back has been hurting for the past week.     Accompanied by Son-Tim  Back Pain This is a new problem. The current episode started in the past 7 days. The problem occurs constantly. The problem has been gradually worsening since onset. The pain is present in the lumbar spine. The quality of the pain is described as aching and shooting. The pain radiates to the left knee, left thigh and left foot. The pain is severe. The pain is The same all the time. The symptoms are aggravated by bending, lying down, standing and twisting. Stiffness is present All day. Associated symptoms include leg pain and weakness. Pertinent negatives include no abdominal pain, bladder incontinence, bowel incontinence, chest pain, dysuria, fever, headaches, numbness, paresis, paresthesias, pelvic pain, perianal numbness, tingling or weight loss. Risk factors include poor posture, sedentary lifestyle and lack of exercise. Treatments tried: OTC pain medication (unable to remember name)   Stage 3b chronic kidney disease (HCC) Repeat CMP Advised to avoid all OTC NSAIDs  Hypothyroidism (acquired) Repeat TSH and t4 Maintain levothyroxine dose  Hyperglycemia Repeat hgbA1c  Aortic atherosclerosis (HCC) Repeat lipid panel and hgbA1c hgbA1c at 6.3%: prediabetes.   BP at goal Maintain atorvastatin dose Advised to Maintain low carb/low sugar diet.  Short-term memory loss CTA neck 04/2020: Negative for large vessel occlusion.Positive for Severe stenosis of the Right PCA P2 segment.Calcified carotid atherosclerosis with only mild stenosis. Dominant left vertebral artery with only mild origin stenosis.Aortic Atherosclerosis (ICD10-I70.0). MRI  brain 2021: Mild right parietal DWI hyperintensity concerning for subacute Infarct. Mild cerebral atrophy and chronic microvascular ischemic changes.  Home with son. She agreed to stop driving. She is independent with ADLs. Denies any help with taking current medications. Entered CCM referral for nurse manager and pharmacist  Back pain with left leg pain, no fall  Pain pill at hs with significant relief Right shoulder  Wt Readings from Last 3 Encounters:  10/25/22 144 lb 9.6 oz (65.6 kg)  04/02/22 144 lb (65.3 kg)  03/13/22 144 lb 9.6 oz (65.6 kg)    Reviewed medical, surgical, and social history today  Medications: Outpatient Medications Prior to Visit  Medication Sig   aspirin EC 81 MG EC tablet Take 1 tablet (81 mg total) by mouth daily. Swallow whole.   [DISCONTINUED] amLODipine (NORVASC) 10 MG tablet Take 1 tablet (10 mg total) by mouth daily.   [DISCONTINUED] atorvastatin (LIPITOR) 40 MG tablet Take 1 tablet (40 mg total) by mouth daily.   [DISCONTINUED] levothyroxine (SYNTHROID) 112 MCG tablet Take 1 tablet (112 mcg total) by mouth daily before breakfast. On empty stomach, before food and other medications   [DISCONTINUED] potassium chloride (KLOR-CON) 10 MEQ tablet TAKE 1 TABLET BY MOUTH EVERY MORNING   [DISCONTINUED] valsartan (DIOVAN) 320 MG tablet Take 1 tablet (320 mg total) by mouth daily.   No facility-administered medications prior to visit.   Reviewed past medical and social history.   ROS per HPI above      Objective:  BP 102/76 (BP Location: Left Arm, Patient Position: Sitting, Cuff Size: Large)   Pulse 90  Temp 98.5 F (36.9 C) (Temporal)   Resp 14   Ht 5' (1.524 m)   Wt 144 lb 9.6 oz (65.6 kg)   LMP  (LMP Unknown)   SpO2 100%   BMI 28.24 kg/m      Physical Exam Cardiovascular:     Rate and Rhythm: Normal rate and regular rhythm.     Pulses: Normal pulses.     Heart sounds: Normal heart sounds.  Pulmonary:     Effort: Pulmonary effort  is normal.     Breath sounds: Normal breath sounds.  Abdominal:     General: Bowel sounds are normal. There is no distension.     Palpations: Abdomen is soft.     Tenderness: There is no abdominal tenderness. There is no guarding.  Musculoskeletal:        General: Tenderness present.     Lumbar back: Tenderness present. Normal range of motion. Negative right straight leg raise test and negative left straight leg raise test.     Right hip: No tenderness, bony tenderness or crepitus. Normal range of motion. Decreased strength.     Left hip: Tenderness present. No bony tenderness or crepitus. Normal range of motion. Decreased strength.     Right upper leg: Normal.     Left upper leg: Normal.     Right knee: Normal.     Left knee: Normal.     Right lower leg: No edema.     Left lower leg: No edema.  Skin:    General: Skin is warm and dry.     Findings: No erythema or rash.  Neurological:     Mental Status: She is alert and oriented to person, place, and time.     Results for orders placed or performed in visit on 10/25/22  TSH  Result Value Ref Range   TSH 0.56 0.35 - 5.50 uIU/mL  T4, free  Result Value Ref Range   Free T4 1.24 0.60 - 1.60 ng/dL  Lipid panel  Result Value Ref Range   Cholesterol 146 0 - 200 mg/dL   Triglycerides 16.1 0.0 - 149.0 mg/dL   HDL 09.60 >45.40 mg/dL   VLDL 98.1 0.0 - 19.1 mg/dL   LDL Cholesterol 51 0 - 99 mg/dL   Total CHOL/HDL Ratio 2    NonHDL 67.26   Hemoglobin A1c  Result Value Ref Range   Hgb A1c MFr Bld 6.1 4.6 - 6.5 %  Comprehensive metabolic panel  Result Value Ref Range   Sodium 138 135 - 145 mEq/L   Potassium 4.2 3.5 - 5.1 mEq/L   Chloride 102 96 - 112 mEq/L   CO2 24 19 - 32 mEq/L   Glucose, Bld 115 (H) 70 - 99 mg/dL   BUN 20 6 - 23 mg/dL   Creatinine, Ser 4.78 0.40 - 1.20 mg/dL   Total Bilirubin 0.7 0.2 - 1.2 mg/dL   Alkaline Phosphatase 54 39 - 117 U/L   AST 22 0 - 37 U/L   ALT 16 0 - 35 U/L   Total Protein 7.2 6.0 - 8.3 g/dL    Albumin 4.1 3.5 - 5.2 g/dL   GFR 29.56 (L) >21.30 mL/min   Calcium 9.2 8.4 - 10.5 mg/dL      Assessment & Plan:    Problem List Items Addressed This Visit       Cardiovascular and Mediastinum   Aortic atherosclerosis (HCC)    Repeat lipid panel and hgbA1c hgbA1c at 6.3%: prediabetes.   BP at  goal Maintain atorvastatin dose Advised to Maintain low carb/low sugar diet.      Relevant Medications   amLODipine (NORVASC) 10 MG tablet   atorvastatin (LIPITOR) 40 MG tablet   valsartan (DIOVAN) 320 MG tablet   Other Relevant Orders   Lipid panel (Completed)   AMB Referral to Chronic Care Management Services   Essential hypertension   Relevant Medications   amLODipine (NORVASC) 10 MG tablet   atorvastatin (LIPITOR) 40 MG tablet   valsartan (DIOVAN) 320 MG tablet   Other Relevant Orders   AMB Referral to Chronic Care Management Services     Endocrine   Hypothyroidism (acquired)    Repeat TSH and t4 Maintain levothyroxine dose      Relevant Orders   TSH (Completed)   T4, free (Completed)   AMB Referral to Chronic Care Management Services     Genitourinary   Stage 3b chronic kidney disease (HCC) - Primary    Repeat CMP Advised to avoid all OTC NSAIDs      Relevant Orders   Comprehensive metabolic panel (Completed)   AMB Referral to Chronic Care Management Services     Other   Hyperglycemia    Repeat hgbA1c      Relevant Orders   Hemoglobin A1c (Completed)   Comprehensive metabolic panel (Completed)   Short-term memory loss    CTA neck 04/2020: Negative for large vessel occlusion.Positive for Severe stenosis of the Right PCA P2 segment.Calcified carotid atherosclerosis with only mild stenosis. Dominant left vertebral artery with only mild origin stenosis.Aortic Atherosclerosis (ICD10-I70.0). MRI brain 2021: Mild right parietal DWI hyperintensity concerning for subacute Infarct. Mild cerebral atrophy and chronic microvascular ischemic changes.  Home with son.  She agreed to stop driving. She is independent with ADLs. Denies any help with taking current medications. Entered CCM referral for nurse manager and pharmacist      Relevant Orders   AMB Referral to Chronic Care Management Services   Other Visit Diagnoses     Acute bilateral low back pain with left-sided sciatica       Relevant Medications   traMADol-acetaminophen (ULTRACET) 37.5-325 MG tablet   Other Relevant Orders   DG Lumbar Spine Complete      Return in about 6 months (around 04/26/2023) for HTN, hyperlipidemia (fasting).     Alysia Penna, NP

## 2022-10-25 NOTE — Patient Instructions (Signed)
Go to lab Maintain current medications Go to [facility] for x-ray

## 2022-10-25 NOTE — Assessment & Plan Note (Signed)
Repeat TSH and t4 Maintain levothyroxine dose

## 2022-10-25 NOTE — Assessment & Plan Note (Signed)
Repeat hgbA1c 

## 2022-10-26 MED ORDER — ATORVASTATIN CALCIUM 40 MG PO TABS
40.0000 mg | ORAL_TABLET | Freq: Every day | ORAL | 3 refills | Status: DC
Start: 2022-10-26 — End: 2024-03-11

## 2022-10-26 MED ORDER — AMLODIPINE BESYLATE 10 MG PO TABS
10.0000 mg | ORAL_TABLET | Freq: Every day | ORAL | 3 refills | Status: DC
Start: 2022-10-26 — End: 2024-03-11

## 2022-10-26 MED ORDER — VALSARTAN 320 MG PO TABS
320.0000 mg | ORAL_TABLET | Freq: Every day | ORAL | 3 refills | Status: DC
Start: 2022-10-26 — End: 2024-03-11

## 2022-10-31 ENCOUNTER — Telehealth: Payer: Self-pay

## 2022-10-31 NOTE — Progress Notes (Signed)
  Chronic Care Management   Note  10/31/2022 Name: Zailee Vallely MRN: [id number] DOB: January 01, 1925  Iyonna Konnor Jorden is a 89 y.o. year old female who is a primary care patient of Nche, Bonna Gains, NP. I reached out to Sela Hilding by phone today in response to a referral sent by Ms. Aviannah Miller Laurel's PCP.  The first contact attempt was unsuccessful.   Follow up plan: Additional outreach attempts will be made.  Penne Lash, RMA Care Guide [facility]  [place], [place] [address] Direct Dial: [phone] [email or web address]

## 2022-11-05 NOTE — Progress Notes (Signed)
  Chronic Care Management   Note  11/05/2022 Name: Shannon Morales MRN: [id number] DOB: [birth year]  Shannon Morales is a 89 y.o. year old female who is a primary care patient of Nche, Bonna Gains, NP. I reached out to Shannon Morales by phone today in response to a referral sent by Ms. Shannon Morales's PCP.  The second contact attempt was unsuccessful.   Follow up plan: Additional outreach attempts will be made.  Shannon Morales, Shannon Morales [facility]  [place], [place] [address] Direct Dial: [phone] [email or web address]

## 2022-11-20 NOTE — Progress Notes (Signed)
  Chronic Care Management   Note  11/20/2022 Name: Shannon Morales MRN: [id number] DOB: Mar 15, 1925  Shannon Morales is a 89 y.o. year old female who is a primary care patient of Nche, Bonna Gains, NP. I reached out to Sela Hilding by phone today in response to a referral sent by Shannon Morales's PCP.  Shannon Morales was given information about Chronic Care Management services today including:  CCM service includes personalized support from designated clinical staff supervised by the physician, including individualized plan of care and coordination with other care providers 24/7 contact phone numbers for assistance for urgent and routine care needs. Service will only be billed when office clinical staff spend 20 minutes or more in a month to coordinate care. Only one practitioner may furnish and bill the service in a calendar month. The patient may stop CCM services at amy time (effective at the end of the month) by phone call to the office staff. The patient will be responsible for cost sharing (co-pay) or up to 20% of the service fee (after annual deductible is met)  Shannon Morales  declinedto scheduling an appointment with the CCM Pharmacist   Follow up plan: Patient did not agree to scheduling an appointment with the Pharmacist. The ordering provider has been notified.   Penne Lash, RMA Care Guide [facility]  [place], [place] [address] Direct Dial: [phone] [email or web address]

## 2023-04-26 ENCOUNTER — Ambulatory Visit (INDEPENDENT_AMBULATORY_CARE_PROVIDER_SITE_OTHER): Payer: Medicare Other | Admitting: Nurse Practitioner

## 2023-04-26 ENCOUNTER — Encounter: Payer: Self-pay | Admitting: Nurse Practitioner

## 2023-04-26 ENCOUNTER — Telehealth: Payer: Self-pay | Admitting: Nurse Practitioner

## 2023-04-26 VITALS — BP 160/70 | HR 77 | Temp 97.9°F | Resp 18 | Ht 60.0 in | Wt 145.4 lb

## 2023-04-26 DIAGNOSIS — I700 Atherosclerosis of aorta: Secondary | ICD-10-CM

## 2023-04-26 DIAGNOSIS — I10 Essential (primary) hypertension: Secondary | ICD-10-CM

## 2023-04-26 DIAGNOSIS — E039 Hypothyroidism, unspecified: Secondary | ICD-10-CM

## 2023-04-26 DIAGNOSIS — R739 Hyperglycemia, unspecified: Secondary | ICD-10-CM

## 2023-04-26 DIAGNOSIS — N1832 Chronic kidney disease, stage 3b: Secondary | ICD-10-CM | POA: Diagnosis not present

## 2023-04-26 DIAGNOSIS — I4510 Unspecified right bundle-branch block: Secondary | ICD-10-CM

## 2023-04-26 LAB — RENAL FUNCTION PANEL
Albumin: 4.7 g/dL (ref 3.5–5.2)
BUN: 14 mg/dL (ref 6–23)
CO2: 33 meq/L — ABNORMAL HIGH (ref 19–32)
Calcium: 10.4 mg/dL (ref 8.4–10.5)
Chloride: 100 meq/L (ref 96–112)
Creatinine, Ser: 1.04 mg/dL (ref 0.40–1.20)
GFR: 45.37 mL/min — ABNORMAL LOW (ref >60.00)
Glucose, Bld: 106 mg/dL — ABNORMAL HIGH (ref 70–99)
Phosphorus: 3.9 mg/dL (ref 2.3–4.6)
Potassium: 4.5 meq/L (ref 3.5–5.1)
Sodium: 142 meq/L (ref 135–145)

## 2023-04-26 LAB — T4, FREE: Free T4: 0.86 ng/dL (ref 0.60–1.60)

## 2023-04-26 LAB — CBC
HCT: 44.8 % (ref 36.0–46.0)
Hemoglobin: 14.6 g/dL (ref 12.0–15.0)
MCHC: 32.5 g/dL (ref 30.0–36.0)
MCV: 93.5 fL (ref 78.0–100.0)
Platelets: 240 10*3/uL (ref 150.0–400.0)
RBC: 4.79 Mil/uL (ref 3.87–5.11)
RDW: 13.7 % (ref 11.5–15.5)
WBC: 8.3 10*3/uL (ref 4.0–10.5)

## 2023-04-26 LAB — TSH: TSH: 13.08 u[IU]/mL — ABNORMAL HIGH (ref 0.35–5.50)

## 2023-04-26 LAB — HEMOGLOBIN A1C: Hgb A1c MFr Bld: 5.9 % (ref 4.6–6.5)

## 2023-04-26 MED ORDER — HYDRALAZINE HCL 10 MG PO TABS
10.0000 mg | ORAL_TABLET | Freq: Two times a day (BID) | ORAL | 5 refills | Status: DC
Start: 2023-04-26 — End: 2024-03-11

## 2023-04-26 NOTE — Assessment & Plan Note (Signed)
BP not at goal Reports home BP 140s-150s/80s Denies any headache or dizziness or LE edema or CP Reports compliance with DASH diet and med doses (amlodipine 10mg  and valsartan 320mg ) BP Readings from Last 3 Encounters:  04/26/23 (!) 160/70  10/25/22 102/76  04/02/22 (!) 152/64    Obtained ECG today: NSR with RBBB, no change compared to 2021 ECG. Ordered renal duplex Maintain current med doses Add hydralazine 10mg  BID F/up in 9month

## 2023-04-26 NOTE — Assessment & Plan Note (Signed)
Repeat TSH and t4 Maintain levothyroxine dose

## 2023-04-26 NOTE — Progress Notes (Signed)
Established Patient Visit  Patient: Shannon Morales   DOB: Feb 04, 1925   89 y.o. Female  MRN: [id number] Visit Date: 04/26/2023  Subjective:    Chief Complaint  Patient presents with   office visit     PT is here for 6 month follow up hypertension, and hyperlipidemia. PT is due for dexa scan. PT is requesting RX refills    HPI Accompanied by daughter.  Essential hypertension BP not at goal Reports home BP 140s-150s/80s Denies any headache or dizziness or LE edema or CP Reports compliance with DASH diet and med doses (amlodipine 10mg  and valsartan 320mg ) BP Readings from Last 3 Encounters:  04/26/23 (!) 160/70  10/25/22 102/76  04/02/22 (!) 152/64    Obtained ECG today: NSR with RBBB, no change compared to 2021 ECG. Ordered renal duplex Maintain current med doses Add hydralazine 10mg  BID F/up in 13month    Hypothyroidism (acquired) Repeat TSH and t4 Maintain levothyroxine dose  Stage 3b chronic kidney disease (HCC) Repeat BMP  Hyperglycemia Repeat hgbA1c  BP Readings from Last 3 Encounters:  04/26/23 (!) 160/70  10/25/22 102/76  04/02/22 (!) 152/64    Wt Readings from Last 3 Encounters:  04/26/23 145 lb 6.4 oz (66 kg)  10/25/22 144 lb 9.6 oz (65.6 kg)  04/02/22 144 lb (65.3 kg)    Reviewed medical, surgical, and social history today  Medications: Outpatient Medications Prior to Visit  Medication Sig   amLODipine (NORVASC) 10 MG tablet Take 1 tablet (10 mg total) by mouth daily.   aspirin EC 81 MG EC tablet Take 1 tablet (81 mg total) by mouth daily. Swallow whole.   atorvastatin (LIPITOR) 40 MG tablet Take 1 tablet (40 mg total) by mouth daily.   levothyroxine (SYNTHROID) 112 MCG tablet TAKE ONE TABLET BY MOUTH EVERY MORNING BEFORE BREAKFAST ON EMPTY STOMACH 30 MINBEFORE FOOD OR OTHER MEDICATIONS   valsartan (DIOVAN) 320 MG tablet Take 1 tablet (320 mg total) by mouth daily.   No facility-administered medications prior to visit.    Reviewed past medical and social history.   ROS per HPI above      Objective:  BP (!) 160/70 (BP Location: Right Arm, Patient Position: Supine)   Pulse 77   Temp 97.9 F (36.6 C) (Temporal)   Resp 18   Ht 5' (1.524 m)   Wt 145 lb 6.4 oz (66 kg)   LMP  (LMP Unknown)   SpO2 97%   BMI 28.40 kg/m      Physical Exam Vitals and nursing note reviewed.  Cardiovascular:     Rate and Rhythm: Normal rate and regular rhythm.     Pulses: Normal pulses.     Heart sounds: Normal heart sounds.  Pulmonary:     Effort: Pulmonary effort is normal.     Breath sounds: Normal breath sounds.  Musculoskeletal:     Right lower leg: No edema.     Left lower leg: No edema.  Neurological:     Mental Status: She is alert and oriented to person, place, and time.     No results found for any visits on 04/26/23.    Assessment & Plan:    Problem List Items Addressed This Visit     Aortic atherosclerosis (HCC)   Relevant Medications   hydrALAZINE (APRESOLINE) 10 MG tablet   Other Relevant Orders   EKG 12-Lead (Completed)   VAS US RENAL ARTERY DUPLEX  Essential hypertension - Primary    BP not at goal Reports home BP 140s-150s/80s Denies any headache or dizziness or LE edema or CP Reports compliance with DASH diet and med doses (amlodipine 10mg  and valsartan 320mg ) BP Readings from Last 3 Encounters:  04/26/23 (!) 160/70  10/25/22 102/76  04/02/22 (!) 152/64    Obtained ECG today: NSR with RBBB, no change compared to 2021 ECG. Ordered renal duplex Maintain current med doses Add hydralazine 10mg  BID F/up in 84month        Relevant Medications   hydrALAZINE (APRESOLINE) 10 MG tablet   Other Relevant Orders   EKG 12-Lead (Completed)   Renal Function Panel   CBC   VAS US RENAL ARTERY DUPLEX   ECHOCARDIOGRAM COMPLETE   Hyperglycemia    Repeat hgbA1c      Relevant Orders   Hemoglobin A1c   Hypothyroidism (acquired)    Repeat TSH and t4 Maintain levothyroxine dose       Relevant Orders   TSH   T4, free   Stage 3b chronic kidney disease (HCC)    Repeat BMP      Relevant Orders   Renal Function Panel   Other Visit Diagnoses     Immunization due       Relevant Orders   Flu Vaccine Trivalent High Dose (Fluad)   RBBB (right bundle branch block)       Relevant Medications   hydrALAZINE (APRESOLINE) 10 MG tablet   Other Relevant Orders   ECHOCARDIOGRAM COMPLETE      Return in about 4 weeks (around 05/24/2023) for HTN.     Alysia Penna, NP

## 2023-04-26 NOTE — Telephone Encounter (Signed)
See note

## 2023-04-26 NOTE — Assessment & Plan Note (Signed)
Repeat hgbA1c 

## 2023-04-26 NOTE — Assessment & Plan Note (Signed)
-   Repeat BMP

## 2023-04-26 NOTE — Patient Instructions (Addendum)
Go to lab Start hydralazine 10mg  2x/day Maintain other medication doses You will be contacted to schedule appointment for renal Ultrasound F/up in 59month

## 2023-04-29 ENCOUNTER — Telehealth (HOSPITAL_COMMUNITY): Payer: Self-pay | Admitting: Nurse Practitioner

## 2023-04-29 NOTE — Telephone Encounter (Signed)
I called to schedule the ordered echocardiogram and patient has decided she does not want to have it per her daughter. Order will be removed from the echo WQ. Thank you.

## 2023-04-30 ENCOUNTER — Ambulatory Visit: Payer: Medicare Other

## 2023-05-01 ENCOUNTER — Ambulatory Visit (INDEPENDENT_AMBULATORY_CARE_PROVIDER_SITE_OTHER): Payer: Medicare Other

## 2023-05-01 DIAGNOSIS — Z23 Encounter for immunization: Secondary | ICD-10-CM | POA: Diagnosis not present

## 2023-05-01 NOTE — Progress Notes (Deleted)
Flu shot given

## 2023-05-01 NOTE — Progress Notes (Signed)
High Dose flu shot given in Right deltoid. Pt tolerated well.

## 2023-05-07 ENCOUNTER — Telehealth: Payer: Self-pay | Admitting: Nurse Practitioner

## 2023-05-07 NOTE — Telephone Encounter (Signed)
Spoke to patient daughter Lynden Ang and voiced that medication list is ready for pick up and will be at front desk. Patient daughter verbalized understanding.

## 2023-05-07 NOTE — Telephone Encounter (Signed)
Pt's daughter, Lynden Ang is needing a copy of her mom's medication list. She would like to pick this up tomorrow 05/08/23 if possible. Cathy at [phone]

## 2023-05-28 ENCOUNTER — Ambulatory Visit: Payer: Medicare Other | Admitting: Nurse Practitioner

## 2023-06-19 ENCOUNTER — Ambulatory Visit: Payer: Medicare Other | Admitting: Nurse Practitioner

## 2023-07-12 ENCOUNTER — Ambulatory Visit: Payer: Medicare Other | Admitting: Nurse Practitioner

## 2023-12-01 ENCOUNTER — Emergency Department (HOSPITAL_BASED_OUTPATIENT_CLINIC_OR_DEPARTMENT_OTHER)

## 2023-12-01 ENCOUNTER — Emergency Department (HOSPITAL_BASED_OUTPATIENT_CLINIC_OR_DEPARTMENT_OTHER)
Admission: EM | Admit: 2023-12-01 | Discharge: 2023-12-01 | Disposition: A | Discharge status: Home / Self Care | Attending: Emergency Medicine | Admitting: Emergency Medicine

## 2023-12-01 ENCOUNTER — Encounter (HOSPITAL_BASED_OUTPATIENT_CLINIC_OR_DEPARTMENT_OTHER): Payer: Self-pay

## 2023-12-01 ENCOUNTER — Other Ambulatory Visit: Payer: Self-pay

## 2023-12-01 DIAGNOSIS — Z7982 Long term (current) use of aspirin: Secondary | ICD-10-CM | POA: Diagnosis not present

## 2023-12-01 DIAGNOSIS — E039 Hypothyroidism, unspecified: Secondary | ICD-10-CM | POA: Diagnosis not present

## 2023-12-01 DIAGNOSIS — Z79899 Other long term (current) drug therapy: Secondary | ICD-10-CM | POA: Diagnosis not present

## 2023-12-01 DIAGNOSIS — Y92480 Sidewalk as the place of occurrence of the external cause: Secondary | ICD-10-CM | POA: Diagnosis not present

## 2023-12-01 DIAGNOSIS — S52572A Other intraarticular fracture of lower end of left radius, initial encounter for closed fracture: Secondary | ICD-10-CM | POA: Insufficient documentation

## 2023-12-01 DIAGNOSIS — I10 Essential (primary) hypertension: Secondary | ICD-10-CM | POA: Diagnosis not present

## 2023-12-01 DIAGNOSIS — M25532 Pain in left wrist: Secondary | ICD-10-CM | POA: Diagnosis present

## 2023-12-01 DIAGNOSIS — W010XXA Fall on same level from slipping, tripping and stumbling without subsequent striking against object, initial encounter: Secondary | ICD-10-CM | POA: Diagnosis not present

## 2023-12-01 DIAGNOSIS — S62102A Fracture of unspecified carpal bone, left wrist, initial encounter for closed fracture: Secondary | ICD-10-CM

## 2023-12-01 MED ORDER — ACETAMINOPHEN 500 MG PO TABS
1000.0000 mg | ORAL_TABLET | Freq: Once | ORAL | Status: AC
  Administered 2023-12-01: 1000 mg via ORAL
  Filled 2023-12-01: qty 2

## 2023-12-01 NOTE — ED Provider Notes (Signed)
 [facility] Provider Note   CSN: [id number] Arrival date & time: 12/01/23  1414     History  Chief Complaint  Patient presents with   Fall   HPI Shannon Morales is a 89 y.o. female with history of hypertension, hypothyroidism, and CVA presenting for fall.  States she was going into [employer] when she tripped and fell on the sidewalk and landed with both her hands outstretched in front of her.   Denies head injury and LOC. Denies neck pain.  She reports that most of the pain is centered around the radial aspect of her left wrist.  She also notes some swelling in that area as well.  States she is able to move her wrist but it is painful with movement.  Brought in by EMS.  She has not tried to walk since her injury.  She does take a daily aspirin  but denies other blood thinners.   Fall       Home Medications Prior to Admission medications   Medication Sig Start Date End Date Taking? Authorizing Provider  amLODipine  (NORVASC ) 10 MG tablet Take 1 tablet (10 mg total) by mouth daily. 10/26/22   Nche, Connye Delaine, NP  aspirin  EC 81 MG EC tablet Take 1 tablet (81 mg total) by mouth daily. Swallow whole. 04/14/20   Modena Andes, MD  atorvastatin  (LIPITOR) 40 MG tablet Take 1 tablet (40 mg total) by mouth daily. 10/26/22   Nche, Connye Delaine, NP  hydrALAZINE  (APRESOLINE ) 10 MG tablet Take 1 tablet (10 mg total) by mouth 2 (two) times daily. 04/26/23   Nche, Connye Delaine, NP  levothyroxine  (SYNTHROID ) 112 MCG tablet TAKE ONE TABLET BY MOUTH EVERY MORNING BEFORE BREAKFAST ON EMPTY STOMACH 30 MINBEFORE FOOD OR OTHER MEDICATIONS 10/25/22   Nche, Connye Delaine, NP  valsartan  (DIOVAN ) 320 MG tablet Take 1 tablet (320 mg total) by mouth daily. 10/26/22   Nche, Connye Delaine, NP      Allergies    Patient has no known allergies.    Review of Systems   See HPI   Physical Exam   Vitals:   12/01/23 1420  BP: (!) 157/74  Pulse: 87  Resp: 18   Temp: 98.6 F (37 C)  SpO2: 95%    CONSTITUTIONAL:  well-appearing, NAD NEURO:  Alert and oriented x 3, CN 3-12 grossly intact EYES:  eyes equal and reactive Head: atraumatic ENT/NECK:  Supple, no stridor, no tenderness to poster neck. ROM of neck is normal without pain  CARDIO:  regular rate and rhythm, appears well-perfused  PULM:  No respiratory distress, CTAB GI/GU:  non-distended, soft MSK/SPINE:  No gross deformities, swelling noted to the radial aspect of the left wrist.  It is tender to touch.  Radial pulses are 2+ bilaterally.  Range of motion of the left wrist also limited due to pain. SKIN:  no rash, atraumatic  *Additional and/or pertinent findings included in MDM below  ED Results / Procedures / Treatments   Labs (all labs ordered are listed, but only abnormal results are displayed) Labs Reviewed - No data to display  EKG None  Radiology DG Wrist Complete Left Result Date: 12/01/2023 CLINICAL DATA:  Injury, fall, pain and swelling EXAM: LEFT WRIST - COMPLETE 3+ VIEW COMPARISON:  None Available. FINDINGS: Bones are osteopenic. Degenerative changes of the wrist joint involving first [facility] joint with joint space loss, sclerosis and bony spurring. There is an acute intra-articular nondisplaced fracture of the left  distal radius. Mild soft tissue swelling on the lateral view. Distal ulna and carpal bones intact. IMPRESSION: Acute nondisplaced intra-articular fracture of the left distal radius. Electronically Signed   By: Melven Stable.  Shick M.D.   On: 12/01/2023 15:23    Procedures Procedures    Medications Ordered in ED Medications  acetaminophen  (TYLENOL ) tablet 1,000 mg (has no administration in time range)    ED Course/ Medical Decision Making/ A&P                                 Medical Decision Making Amount and/or Complexity of Data Reviewed Radiology: ordered.   89 year old well-appearing female presenting for mechanical fall.  Exam notable for tenderness and  swelling about the left wrist.  X-ray revealed acute intra-articular nondisplaced fracture of the left distal radius.  This is likely the cause of her symptoms.  Placed sugar-tong splint.  Reassessed and remained neurovascularly intact.  Advised to follow-up with a hand surgery. Discussed return precautions. Discharged.         Final Clinical Impression(s) / ED Diagnoses Final diagnoses:  Closed fracture of left wrist, initial encounter    Rx / DC Orders ED Discharge Orders     None         Janalee Mcmurray, PA-C 12/01/23 1554    Albertus Hughs, DO 12/02/23 [phone]

## 2023-12-01 NOTE — Discharge Instructions (Addendum)
 Evaluation today revealed that you do have a fracture in your left wrist specifically an acute intra-articular nondisplaced fracture of the left distal radius.  At this time maintain sugar-tong splint until you are evaluated by the hand doctor.  If you develop any numbness, new trauma to the wrist, or any other concerning symptom please return to the ED for further evaluation.  Please schedule an appointment as soon as possible to be seen by the hand doctor.

## 2023-12-01 NOTE — ED Triage Notes (Signed)
 Reports mechanical fall this afternoon.  Reports pain in L wrist. Swelling noted Bruising present to L eye. Pt states eyeglasses hit face but denies head injury from fall.   Denies LOC, blood thinners

## 2024-01-08 DIAGNOSIS — S52502D Unspecified fracture of the lower end of left radius, subsequent encounter for closed fracture with routine healing: Secondary | ICD-10-CM | POA: Diagnosis not present

## 2024-02-17 DIAGNOSIS — S52502A Unspecified fracture of the lower end of left radius, initial encounter for closed fracture: Secondary | ICD-10-CM | POA: Diagnosis not present

## 2024-03-11 ENCOUNTER — Encounter: Payer: Self-pay | Admitting: Nurse Practitioner

## 2024-03-11 ENCOUNTER — Ambulatory Visit (INDEPENDENT_AMBULATORY_CARE_PROVIDER_SITE_OTHER): Admitting: Nurse Practitioner

## 2024-03-11 VITALS — BP 146/78 | HR 64 | Temp 97.7°F | Ht 60.0 in | Wt 139.8 lb

## 2024-03-11 DIAGNOSIS — E039 Hypothyroidism, unspecified: Secondary | ICD-10-CM

## 2024-03-11 DIAGNOSIS — I10 Essential (primary) hypertension: Secondary | ICD-10-CM

## 2024-03-11 DIAGNOSIS — I700 Atherosclerosis of aorta: Secondary | ICD-10-CM

## 2024-03-11 DIAGNOSIS — N1832 Chronic kidney disease, stage 3b: Secondary | ICD-10-CM

## 2024-03-11 DIAGNOSIS — R739 Hyperglycemia, unspecified: Secondary | ICD-10-CM | POA: Diagnosis not present

## 2024-03-11 DIAGNOSIS — R040 Epistaxis: Secondary | ICD-10-CM | POA: Diagnosis not present

## 2024-03-11 LAB — CBC
HCT: 40.2 % (ref 36.0–46.0)
Hemoglobin: 13.4 g/dL (ref 12.0–15.0)
MCHC: 33.4 g/dL (ref 30.0–36.0)
MCV: 90.8 fl (ref 78.0–100.0)
Platelets: 215 K/uL (ref 150.0–400.0)
RBC: 4.43 Mil/uL (ref 3.87–5.11)
RDW: 14.2 % (ref 11.5–15.5)
WBC: 8 K/uL (ref 4.0–10.5)

## 2024-03-11 LAB — HEMOGLOBIN A1C: Hgb A1c MFr Bld: 6.3 % (ref 4.6–6.5)

## 2024-03-11 MED ORDER — OXYMETAZOLINE HCL 0.05 % NA SOLN
1.0000 | Freq: Two times a day (BID) | NASAL | Status: DC
Start: 2024-03-11 — End: 2024-03-11

## 2024-03-11 MED ORDER — SALINE SPRAY 0.65 % NA SOLN
1.0000 | NASAL | Status: AC | PRN
Start: 2024-03-11 — End: ?

## 2024-03-11 MED ORDER — HYDRALAZINE HCL 10 MG PO TABS: 10.0000 mg | ORAL_TABLET | Freq: Two times a day (BID) | ORAL | 0 refills | Status: DC

## 2024-03-11 MED ORDER — VALSARTAN 320 MG PO TABS: 320.0000 mg | ORAL_TABLET | ORAL | 0 refills | Status: DC

## 2024-03-11 MED ORDER — LEVOTHYROXINE SODIUM 112 MCG PO TABS: 112.0000 ug | ORAL_TABLET | Freq: Every day | ORAL | 0 refills | Status: AC

## 2024-03-11 MED ORDER — AMLODIPINE BESYLATE 10 MG PO TABS: 10.0000 mg | ORAL_TABLET | Freq: Every day | ORAL | 3 refills | Status: AC

## 2024-03-11 MED ORDER — ATORVASTATIN CALCIUM 40 MG PO TABS: 40.0000 mg | ORAL_TABLET | Freq: Every evening | ORAL | 0 refills | Status: DC

## 2024-03-11 MED ORDER — OXYMETAZOLINE HCL 0.05 % NA SOLN
2.0000 | Freq: Every day | NASAL | Status: AC | PRN
Start: 2024-03-11 — End: ?

## 2024-03-11 NOTE — Assessment & Plan Note (Signed)
Repeat Tsh and T4 Maintain levothyroxine dose

## 2024-03-11 NOTE — Assessment & Plan Note (Signed)
 Repeat CBC and CMP.

## 2024-03-11 NOTE — Patient Instructions (Signed)
 Go to lab  Nosebleed, Adult A nosebleed is when blood comes out of the nose. Nosebleeds are common and can be caused by many things. They are usually not a sign of a serious medical problem. Follow these instructions at home: When you have a nosebleed:  Sit down. Tilt your head forward a little. Follow these steps: Pinch your nose with a clean towel or tissue. Keep pinching your nose for 5 minutes. Do not let go. After 5 minutes, let go of your nose. Keep doing these steps until the bleeding stops. Do not put tissues or other things in your nose to stop the bleeding. Avoid lying down or putting your head back. Use a nose spray decongestant as told by your doctor. After a nosebleed: Try not to blow your nose or sniffle for several hours. Try not to strain, lift, or bend at the waist for several days. Aspirin  and medicines that thin your blood make bleeding more likely. If you take these medicines: Ask your doctor if you should stop taking them or if you should change how much you take. Do not stop taking the medicine unless your doctor tells you to. If your nosebleed was caused by dryness, use over-the-counter saline nasal spray or gel and a humidifier as told by your doctor. This will keep the inside of your nose moist and allow it to heal. If you need to use nasal spray or gel: Choose one that is water-soluble. Use only as much as you need and use it only as often as needed. Do not lie down right away after you use it. If you get nosebleeds often, talk with your doctor about treatments. These may include: Nasal cautery. A chemical swab or electrical device is used to lightly burn tiny blood vessels inside the nose. This helps stop or prevent nosebleeds. Nasal packing. A gauze or other material is placed in the nose to keep constant pressure on the bleeding area. Contact a doctor if: You have a fever. You get nosebleeds often. You get nosebleeds more often than usual. You bruise  very easily. You have something stuck in your nose. You are bleeding in your mouth. You vomit or cough up brown material. You get a nosebleed after you start a new medicine. Get help right away if: You have a nosebleed after you fall or hurt your head. Your nosebleed does not go away after 20 minutes. You feel dizzy or weak. You have unusual bleeding from other parts of your body. You have unusual bruising on other parts of your body. You get sweaty. You vomit blood. Summary Nosebleeds are common. They are usually not a sign of a serious medical problem. When you have a nosebleed, sit down and tilt your head a little forward. Pinch your nose with a clean tissue for 5 minutes. Use saline spray or saline gel and a humidifier as told by your doctor. Get help right away if your nosebleed does not go away after 20 minutes. This information is not intended to replace advice given to you by your health care provider. Make sure you discuss any questions you have with your health care provider. Document Revised: 06/27/2021 Document Reviewed: 06/27/2021 Elsevier Patient Education  2024 [employer].

## 2024-03-11 NOTE — Progress Notes (Signed)
 Acute Office Visit  Subjective:    Patient ID: Shannon Morales, female    DOB: 23-Apr-1925, 89 y.o.   MRN: [id number]  Chief Complaint  Patient presents with   Epistaxis    Started Sunday and today at home    Epistaxis  The bleeding has been from the left nare. This is a new problem. The current episode started in the past 7 days. The problem occurs every few hours. The problem has been waxing and waning. The bleeding is associated with aspirin . She has tried pressure for the symptoms. The treatment provided mild relief. There is no history of allergies, a bleeding disorder, colds, frequent nosebleeds or sinus problems.   Accompanied by daughter and son  Patient is in today for frequent nosebleeds since Sunday. No headache or dizziness or sinus/nasal congestion. Son admits she has been out of some medications. He is unsure if she has been taking aspirin  or not.  Essential hypertension BP not at goal due to med noncompliance Unable to recognize medications by name She is home with son who is suppose to help with medications Son states she has been out of some medications. He has not assisted in putting medications in daily med box. BP Readings from Last 3 Encounters:  03/11/24 (!) 146/78  12/01/23 (!) 157/74  04/26/23 (!) 160/70    Nosebleed related to use of aspirin  and uncontrolled BP Check CBC, and CMP Encouraged to take meds as place in med daily dispenser. F/up in 2weeks  Aortic atherosclerosis (HCC) Repeat lipid panel  Hypothyroidism (acquired) Repeat Tsh and T4 Maintain levothyroxine  dose  Stage 3b chronic kidney disease (HCC) Repeat CBC and CMP   Outpatient Medications Prior to Visit  Medication Sig   [DISCONTINUED] amLODipine  (NORVASC ) 10 MG tablet Take 1 tablet (10 mg total) by mouth daily.   [DISCONTINUED] atorvastatin  (LIPITOR) 40 MG tablet Take 1 tablet (40 mg total) by mouth daily.   [DISCONTINUED] levothyroxine  (SYNTHROID ) 112 MCG tablet TAKE ONE  TABLET BY MOUTH EVERY MORNING BEFORE BREAKFAST ON EMPTY STOMACH 30 MINBEFORE FOOD OR OTHER MEDICATIONS   [DISCONTINUED] valsartan  (DIOVAN ) 320 MG tablet Take 1 tablet (320 mg total) by mouth daily.   [DISCONTINUED] aspirin  EC 81 MG EC tablet Take 1 tablet (81 mg total) by mouth daily. Swallow whole.   [DISCONTINUED] hydrALAZINE  (APRESOLINE ) 10 MG tablet Take 1 tablet (10 mg total) by mouth 2 (two) times daily.   No facility-administered medications prior to visit.    Reviewed past medical and social history.  Review of Systems  HENT:  Positive for nosebleeds.    Per HPI     Objective:    Physical Exam Vitals and nursing note reviewed.  Constitutional:      General: She is not in acute distress. HENT:     Nose: No nasal tenderness, mucosal edema, congestion or rhinorrhea.     Right Nostril: No foreign body, epistaxis, septal hematoma or occlusion.     Left Nostril: Epistaxis present. No foreign body, septal hematoma or occlusion.     Right Turbinates: Not enlarged, swollen or pale.     Left Turbinates: Not enlarged, swollen or pale.     Right Sinus: No maxillary sinus tenderness or frontal sinus tenderness.     Left Sinus: No maxillary sinus tenderness or frontal sinus tenderness.  Cardiovascular:     Rate and Rhythm: Normal rate and regular rhythm.     Pulses: Normal pulses.     Heart sounds: Normal heart sounds.  Pulmonary:  Effort: Pulmonary effort is normal.     Breath sounds: Normal breath sounds.  Neurological:     Mental Status: She is alert and oriented to person, place, and time.    BP (!) 146/78 (BP Location: Left Arm, Patient Position: Supine, Cuff Size: Normal)   Pulse 64   Temp 97.7 F (36.5 C) (Oral)   Ht 5' (1.524 m)   Wt 139 lb 12.8 oz (63.4 kg)   LMP  (LMP Unknown)   SpO2 97%   BMI 27.30 kg/m    No results found for any visits on 03/11/24.     Assessment & Plan:   Problem List Items Addressed This Visit     Aortic atherosclerosis (HCC)    Relevant Medications   amLODipine  (NORVASC ) 10 MG tablet   atorvastatin  (LIPITOR) 40 MG tablet   hydrALAZINE  (APRESOLINE ) 10 MG tablet   valsartan  (DIOVAN ) 320 MG tablet   Other Relevant Orders   Lipid panel   Essential hypertension   Relevant Medications   amLODipine  (NORVASC ) 10 MG tablet   atorvastatin  (LIPITOR) 40 MG tablet   hydrALAZINE  (APRESOLINE ) 10 MG tablet   valsartan  (DIOVAN ) 320 MG tablet   Other Relevant Orders   Comprehensive metabolic panel with GFR   Hyperglycemia   Relevant Orders   Hemoglobin A1c   Hypothyroidism (acquired)   Relevant Medications   levothyroxine  (SYNTHROID ) 112 MCG tablet   Other Relevant Orders   T4, free   TSH   Stage 3b chronic kidney disease (HCC)   Relevant Orders   Comprehensive metabolic panel with GFR   Other Visit Diagnoses       Left-sided epistaxis    -  Primary   Relevant Medications   sodium chloride  (OCEAN) 0.65 % SOLN nasal spray   oxymetazoline  (AFRIN NASAL SPRAY) 0.05 % nasal spray   Other Relevant Orders   CBC      Meds ordered this encounter  Medications   DISCONTD: oxymetazoline  (AFRIN NASAL SPRAY) 0.05 % nasal spray    Sig: Place 1 spray into both nostrils 2 (two) times daily. Use only for 3days, then stop    Supervising Provider:   KREMER, WILLIAM ALFRED [[id number]]   sodium chloride  (OCEAN) 0.65 % SOLN nasal spray    Sig: Place 1 spray into both nostrils as needed for congestion.    Supervising Provider:   BERNETA FALLOW ALFRED [[id number]]   oxymetazoline  (AFRIN NASAL SPRAY) 0.05 % nasal spray    Sig: Place 2 sprays into both nostrils daily as needed for congestion. Use only for 3days, then stop    Supervising Provider:   BERNETA FALLOW ALFRED [[id number]]   amLODipine  (NORVASC ) 10 MG tablet    Sig: Take 1 tablet (10 mg total) by mouth daily.    Dispense:  90 tablet    Refill:  3    Supervising Provider:   KREMER, WILLIAM ALFRED [[id number]]   atorvastatin  (LIPITOR) 40 MG tablet    Sig: Take 1 tablet (40 mg total) by mouth  every evening.    Dispense:  90 tablet    Refill:  0    Supervising Provider:   BERNETA FALLOW ALFRED [[id number]]   hydrALAZINE  (APRESOLINE ) 10 MG tablet    Sig: Take 1 tablet (10 mg total) by mouth in the morning and at bedtime.    Dispense:  180 tablet    Refill:  0    Supervising Provider:   BERNETA FALLOW ALFRED [[id number]]   levothyroxine  (SYNTHROID ) 112 MCG tablet  Sig: Take 1 tablet (112 mcg total) by mouth daily before breakfast.    Dispense:  90 tablet    Refill:  0    Supervising Provider:   BERNETA FALLOW ALFRED [[id number]]   valsartan  (DIOVAN ) 320 MG tablet    Sig: Take 1 tablet (320 mg total) by mouth every morning.    Dispense:  90 tablet    Refill:  0    Supervising Provider:   BERNETA FALLOW ALFRED [[id number]]  Stop aspirin , use afrin to stop nosebleed, use saline mist daily to prevent nosebleed.  Return in about 2 weeks (around 03/25/2024) for HTN and nosebleed.    Roselie Mood, NP

## 2024-03-11 NOTE — Assessment & Plan Note (Signed)
 BP not at goal due to med noncompliance Unable to recognize medications by name She is home with son who is suppose to help with medications Son states she has been out of some medications. He has not assisted in putting medications in daily med box. BP Readings from Last 3 Encounters:  03/11/24 (!) 146/78  12/01/23 (!) 157/74  04/26/23 (!) 160/70    Nosebleed related to use of aspirin  and uncontrolled BP Check CBC, and CMP Encouraged to take meds as place in med daily dispenser. F/up in 2weeks

## 2024-03-11 NOTE — Assessment & Plan Note (Signed)
 Repeat lipid panel ?

## 2024-03-12 ENCOUNTER — Ambulatory Visit: Payer: Self-pay | Admitting: Nurse Practitioner

## 2024-03-12 DIAGNOSIS — E039 Hypothyroidism, unspecified: Secondary | ICD-10-CM

## 2024-03-12 LAB — COMPREHENSIVE METABOLIC PANEL WITH GFR
ALT: 12 U/L (ref 0–35)
AST: 20 U/L (ref 0–37)
Albumin: 4.4 g/dL (ref 3.5–5.2)
Alkaline Phosphatase: 56 U/L (ref 39–117)
BUN: 16 mg/dL (ref 6–23)
CO2: 27 meq/L (ref 19–32)
Calcium: 9.8 mg/dL (ref 8.4–10.5)
Chloride: 104 meq/L (ref 96–112)
Creatinine, Ser: 1.04 mg/dL (ref 0.40–1.20)
GFR: 45.09 mL/min — ABNORMAL LOW (ref >60.00)
Glucose, Bld: 125 mg/dL — ABNORMAL HIGH (ref 70–99)
Potassium: 3.5 meq/L (ref 3.5–5.1)
Sodium: 141 meq/L (ref 135–145)
Total Bilirubin: 0.5 mg/dL (ref 0.2–1.2)
Total Protein: 7.5 g/dL (ref 6.0–8.3)

## 2024-03-12 LAB — LIPID PANEL
Cholesterol: 142 mg/dL (ref 0–200)
HDL: 81.2 mg/dL (ref >39.00)
LDL Cholesterol: 45 mg/dL (ref 0–99)
NonHDL: 61.11
Total CHOL/HDL Ratio: 2
Triglycerides: 80 mg/dL (ref 0.0–149.0)
VLDL: 16 mg/dL (ref 0.0–40.0)

## 2024-03-12 LAB — T4, FREE: Free T4: 0.83 ng/dL (ref 0.60–1.60)

## 2024-03-12 LAB — TSH: TSH: 9.57 u[IU]/mL — ABNORMAL HIGH (ref 0.35–5.50)

## 2024-03-25 ENCOUNTER — Ambulatory Visit: Admitting: Nurse Practitioner

## 2024-03-25 ENCOUNTER — Encounter: Payer: Self-pay | Admitting: Nurse Practitioner

## 2024-03-25 VITALS — BP 140/60 | HR 86 | Temp 98.2°F | Ht 60.0 in | Wt 141.8 lb

## 2024-03-25 DIAGNOSIS — Z23 Encounter for immunization: Secondary | ICD-10-CM | POA: Diagnosis not present

## 2024-03-25 DIAGNOSIS — I10 Essential (primary) hypertension: Secondary | ICD-10-CM

## 2024-03-25 MED ORDER — HYDRALAZINE HCL 10 MG PO TABS: 10.0000 mg | ORAL_TABLET | Freq: Three times a day (TID) | ORAL | 2 refills | Status: AC

## 2024-03-25 NOTE — Assessment & Plan Note (Signed)
 BP not at goal Reports compliance with BP meds. Assisted by her son. Denies any adverse effects BP Readings from Last 3 Encounters:  03/25/24 (!) 140/60  03/11/24 (!) 146/78  12/01/23 (!) 157/74    Increase hydralazine  10mg  to TID Maintain other med doses Advised to monitor BP at home and bring to next appointment. F/up in 2months

## 2024-03-25 NOTE — Patient Instructions (Signed)
 Increase hydralazine  dose to 10mg  in AM, at noon and in PM Maintain other med doses Check BP at home in AM, no less than 3x/week. Call office if BP remains >140/80 for more than 1week. Bring BP readings and machine to next appointment. Maintain a low salt/sodium diet, and daily exercise (e.g. walking 10-73min or chair exercises) Avoid salt substitute seasonings

## 2024-03-25 NOTE — Progress Notes (Signed)
 Established Patient Visit  Patient: Shannon Morales   DOB: 01/21/1925   89 y.o. Female  MRN: [id number] Visit Date: 03/25/2024  Subjective:    Chief Complaint  Patient presents with   Follow-up    NOT FASTING 2 week follow up for HTN and nose bleeds    HPI Resolved nose bleeds. Essential hypertension BP not at goal Reports compliance with BP meds. Assisted by her son. Denies any adverse effects BP Readings from Last 3 Encounters:  03/25/24 (!) 140/60  03/11/24 (!) 146/78  12/01/23 (!) 157/74    Increase hydralazine  10mg  to TID Maintain other med doses Advised to monitor BP at home and bring to next appointment. F/up in 2months    Reviewed medical, surgical, and social history today  Medications: Outpatient Medications Prior to Visit  Medication Sig   amLODipine  (NORVASC ) 10 MG tablet Take 1 tablet (10 mg total) by mouth daily.   atorvastatin  (LIPITOR) 40 MG tablet Take 1 tablet (40 mg total) by mouth every evening.   levothyroxine  (SYNTHROID ) 112 MCG tablet Take 1 tablet (112 mcg total) by mouth daily before breakfast.   oxymetazoline  (AFRIN NASAL SPRAY) 0.05 % nasal spray Place 2 sprays into both nostrils daily as needed for congestion. Use only for 3days, then stop   sodium chloride  (OCEAN) 0.65 % SOLN nasal spray Place 1 spray into both nostrils as needed for congestion.   valsartan  (DIOVAN ) 320 MG tablet Take 1 tablet (320 mg total) by mouth every morning.   [DISCONTINUED] hydrALAZINE  (APRESOLINE ) 10 MG tablet Take 1 tablet (10 mg total) by mouth in the morning and at bedtime.   No facility-administered medications prior to visit.   Reviewed past medical and social history.   ROS per HPI above      Objective:  BP (!) 140/60 (BP Location: Right Arm, Patient Position: Sitting, Cuff Size: Normal)   Pulse 86   Temp 98.2 F (36.8 C) (Oral)   Ht 5' (1.524 m)   Wt 141 lb 12.8 oz (64.3 kg)   LMP  (LMP Unknown)   SpO2 97%   BMI 27.69 kg/m       Physical Exam Vitals and nursing note reviewed.  Cardiovascular:     Rate and Rhythm: Normal rate.     Pulses: Normal pulses.  Pulmonary:     Effort: Pulmonary effort is normal.  Musculoskeletal:     Right lower leg: No edema.     Left lower leg: No edema.  Neurological:     Mental Status: She is alert and oriented to person, place, and time.     No results found for any visits on 03/25/24.    Assessment & Plan:    Problem List Items Addressed This Visit     Essential hypertension   BP not at goal Reports compliance with BP meds. Assisted by her son. Denies any adverse effects BP Readings from Last 3 Encounters:  03/25/24 (!) 140/60  03/11/24 (!) 146/78  12/01/23 (!) 157/74    Increase hydralazine  10mg  to TID Maintain other med doses Advised to monitor BP at home and bring to next appointment. F/up in 2months      Relevant Medications   hydrALAZINE  (APRESOLINE ) 10 MG tablet   Other Visit Diagnoses       Immunization due    -  Primary   Relevant Orders   Flu vaccine HIGH DOSE PF(Fluzone Trivalent)  Return in about 2 months (around 05/25/2024) for HTN, Hypothyroidism.     Roselie Mood, NP

## 2024-05-21 ENCOUNTER — Ambulatory Visit: Admitting: Nurse Practitioner

## 2024-05-22 ENCOUNTER — Other Ambulatory Visit: Payer: Self-pay | Admitting: Nurse Practitioner

## 2024-05-22 DIAGNOSIS — I10 Essential (primary) hypertension: Secondary | ICD-10-CM

## 2024-05-22 DIAGNOSIS — I700 Atherosclerosis of aorta: Secondary | ICD-10-CM

## 2024-06-24 ENCOUNTER — Telehealth: Payer: Self-pay

## 2024-06-24 NOTE — Progress Notes (Signed)
" ° °  06/24/2024  Patient ID: Shannon Morales, female   DOB: Jan 27, 1925, 89 y.o.   MRN: [id number]  This patient is appearing on a report for being at risk of failing the adherence measure for cholesterol (statin) and hypertension (ACEi/ARB) medications this calendar year.   Medication: atorvastatin  40mg , valsartan  320mg  Last fill date: 05/25/24 for 90 day supply  Insurance report was not up to date. No action needed at this time.   Channing DELENA Mealing, PharmD, DPLA   "
